# Patient Record
Sex: Male | Born: 1940 | Race: White | Hispanic: No | Marital: Married | Smoking: Former smoker
Health system: Southern US, Community
[De-identification: ages and names within clinical notes are randomized; demographics above are authoritative.]

## PROBLEM LIST (undated history)

## (undated) DIAGNOSIS — N189 Chronic kidney disease, unspecified: Secondary | ICD-10-CM

## (undated) DIAGNOSIS — I219 Acute myocardial infarction, unspecified: Secondary | ICD-10-CM

## (undated) DIAGNOSIS — G4733 Obstructive sleep apnea (adult) (pediatric): Secondary | ICD-10-CM

## (undated) DIAGNOSIS — E669 Obesity, unspecified: Secondary | ICD-10-CM

## (undated) DIAGNOSIS — I10 Essential (primary) hypertension: Secondary | ICD-10-CM

## (undated) DIAGNOSIS — E119 Type 2 diabetes mellitus without complications: Secondary | ICD-10-CM

## (undated) DIAGNOSIS — I2510 Atherosclerotic heart disease of native coronary artery without angina pectoris: Secondary | ICD-10-CM

## (undated) DIAGNOSIS — I739 Peripheral vascular disease, unspecified: Secondary | ICD-10-CM

## (undated) DIAGNOSIS — E785 Hyperlipidemia, unspecified: Secondary | ICD-10-CM

## (undated) HISTORY — DX: Essential (primary) hypertension: I10

## (undated) HISTORY — DX: Acute myocardial infarction, unspecified: I21.9

## (undated) HISTORY — PX: ANTERIOR FUSION LUMBAR SPINE: SUR629

## (undated) HISTORY — DX: Hyperlipidemia, unspecified: E78.5

## (undated) HISTORY — DX: Obstructive sleep apnea (adult) (pediatric): G47.33

## (undated) HISTORY — DX: Type 2 diabetes mellitus without complications: E11.9

## (undated) HISTORY — PX: LAMINECTOMY: SHX219

## (undated) HISTORY — DX: Peripheral vascular disease, unspecified: I73.9

## (undated) HISTORY — DX: Obesity, unspecified: E66.9

## (undated) HISTORY — DX: Chronic kidney disease, unspecified: N18.9

## (undated) HISTORY — DX: Atherosclerotic heart disease of native coronary artery without angina pectoris: I25.10

---

## 2002-06-07 ENCOUNTER — Inpatient Hospital Stay (HOSPITAL_COMMUNITY): Admission: EM | Admit: 2002-06-07 | Discharge: 2002-06-18 | Payer: Self-pay | Admitting: Emergency Medicine

## 2002-06-07 ENCOUNTER — Encounter: Payer: Self-pay | Admitting: Emergency Medicine

## 2002-06-08 ENCOUNTER — Encounter: Payer: Self-pay | Admitting: Cardiology

## 2002-06-08 ENCOUNTER — Encounter: Payer: Self-pay | Admitting: Cardiothoracic Surgery

## 2002-06-08 HISTORY — PX: CORONARY ARTERY BYPASS GRAFT: SHX141

## 2002-06-09 ENCOUNTER — Encounter: Payer: Self-pay | Admitting: Cardiothoracic Surgery

## 2002-06-10 ENCOUNTER — Encounter: Payer: Self-pay | Admitting: Cardiothoracic Surgery

## 2002-06-11 ENCOUNTER — Encounter: Payer: Self-pay | Admitting: Cardiothoracic Surgery

## 2002-06-12 ENCOUNTER — Encounter: Payer: Self-pay | Admitting: Cardiothoracic Surgery

## 2002-06-13 ENCOUNTER — Encounter: Payer: Self-pay | Admitting: Cardiothoracic Surgery

## 2002-06-14 ENCOUNTER — Encounter: Payer: Self-pay | Admitting: Cardiothoracic Surgery

## 2002-06-15 ENCOUNTER — Encounter: Payer: Self-pay | Admitting: Cardiothoracic Surgery

## 2002-06-16 ENCOUNTER — Encounter: Payer: Self-pay | Admitting: Cardiothoracic Surgery

## 2002-10-26 ENCOUNTER — Encounter: Payer: Self-pay | Admitting: Vascular Surgery

## 2002-10-27 ENCOUNTER — Ambulatory Visit (HOSPITAL_COMMUNITY): Admission: RE | Admit: 2002-10-27 | Discharge: 2002-10-27 | Payer: Self-pay | Admitting: Vascular Surgery

## 2003-02-22 ENCOUNTER — Ambulatory Visit: Admission: RE | Admit: 2003-02-22 | Discharge: 2003-02-22 | Payer: Self-pay | Admitting: Vascular Surgery

## 2003-03-17 ENCOUNTER — Ambulatory Visit (HOSPITAL_COMMUNITY): Admission: RE | Admit: 2003-03-17 | Discharge: 2003-03-18 | Payer: Self-pay | Admitting: Vascular Surgery

## 2004-06-11 ENCOUNTER — Ambulatory Visit: Payer: Self-pay | Admitting: Cardiology

## 2004-07-23 ENCOUNTER — Ambulatory Visit: Payer: Self-pay | Admitting: Cardiology

## 2004-07-27 ENCOUNTER — Ambulatory Visit: Payer: Self-pay | Admitting: Internal Medicine

## 2005-02-07 ENCOUNTER — Ambulatory Visit: Payer: Self-pay | Admitting: Cardiology

## 2005-02-13 ENCOUNTER — Ambulatory Visit: Payer: Self-pay | Admitting: Cardiology

## 2005-02-18 ENCOUNTER — Ambulatory Visit: Payer: Self-pay

## 2005-02-19 ENCOUNTER — Ambulatory Visit: Payer: Self-pay | Admitting: Internal Medicine

## 2005-02-20 ENCOUNTER — Ambulatory Visit: Payer: Self-pay | Admitting: Cardiology

## 2005-02-26 ENCOUNTER — Ambulatory Visit (HOSPITAL_BASED_OUTPATIENT_CLINIC_OR_DEPARTMENT_OTHER): Admission: RE | Admit: 2005-02-26 | Discharge: 2005-02-26 | Payer: Self-pay | Admitting: Family Medicine

## 2005-03-06 ENCOUNTER — Ambulatory Visit: Payer: Self-pay | Admitting: Internal Medicine

## 2005-03-06 ENCOUNTER — Encounter (INDEPENDENT_AMBULATORY_CARE_PROVIDER_SITE_OTHER): Payer: Self-pay | Admitting: Specialist

## 2005-03-16 ENCOUNTER — Ambulatory Visit: Payer: Self-pay | Admitting: Pulmonary Disease

## 2005-05-28 ENCOUNTER — Ambulatory Visit: Payer: Self-pay | Admitting: Cardiology

## 2006-01-08 ENCOUNTER — Ambulatory Visit: Payer: Self-pay | Admitting: Cardiology

## 2006-09-18 ENCOUNTER — Ambulatory Visit: Payer: Self-pay | Admitting: Cardiology

## 2006-09-18 LAB — CONVERTED CEMR LAB
ALT: 30 units/L (ref 0–40)
AST: 32 units/L (ref 0–37)
Albumin: 3.8 g/dL (ref 3.5–5.2)
Alkaline Phosphatase: 109 units/L (ref 39–117)
BUN: 18 mg/dL (ref 6–23)
Bilirubin, Direct: 0.2 mg/dL (ref 0.0–0.3)
CO2: 30 meq/L (ref 19–32)
Calcium: 8.9 mg/dL (ref 8.4–10.5)
Chloride: 106 meq/L (ref 96–112)
Cholesterol: 128 mg/dL (ref 0–200)
Creatinine, Ser: 1.1 mg/dL (ref 0.4–1.5)
GFR calc Af Amer: 86 mL/min
GFR calc non Af Amer: 71 mL/min
Glucose, Bld: 119 mg/dL — ABNORMAL HIGH (ref 70–99)
HDL: 38.6 mg/dL — ABNORMAL LOW (ref >39.0)
LDL Cholesterol: 68 mg/dL (ref 0–99)
Potassium: 4.4 meq/L (ref 3.5–5.1)
Sodium: 141 meq/L (ref 135–145)
Total Bilirubin: 1.4 mg/dL — ABNORMAL HIGH (ref 0.3–1.2)
Total CHOL/HDL Ratio: 3.3
Total Protein: 6.5 g/dL (ref 6.0–8.3)
Triglycerides: 106 mg/dL (ref 0–149)
VLDL: 21 mg/dL (ref 0–40)

## 2006-10-03 ENCOUNTER — Ambulatory Visit: Payer: Self-pay

## 2006-10-28 ENCOUNTER — Ambulatory Visit: Payer: Self-pay | Admitting: Vascular Surgery

## 2007-06-29 ENCOUNTER — Ambulatory Visit: Payer: Self-pay | Admitting: Cardiology

## 2007-06-29 LAB — CONVERTED CEMR LAB
ALT: 29 units/L (ref 0–53)
AST: 31 units/L (ref 0–37)
Albumin: 3.8 g/dL (ref 3.5–5.2)
Alkaline Phosphatase: 102 units/L (ref 39–117)
BUN: 19 mg/dL (ref 6–23)
Bilirubin, Direct: 0.2 mg/dL (ref 0.0–0.3)
CO2: 31 meq/L (ref 19–32)
Calcium: 9.4 mg/dL (ref 8.4–10.5)
Chloride: 106 meq/L (ref 96–112)
Cholesterol: 130 mg/dL (ref 0–200)
Creatinine, Ser: 1.2 mg/dL (ref 0.4–1.5)
GFR calc Af Amer: 77 mL/min
GFR calc non Af Amer: 64 mL/min
Glucose, Bld: 128 mg/dL — ABNORMAL HIGH (ref 70–99)
HDL: 33.3 mg/dL — ABNORMAL LOW (ref >39.0)
LDL Cholesterol: 67 mg/dL (ref 0–99)
Potassium: 4.3 meq/L (ref 3.5–5.1)
Sodium: 142 meq/L (ref 135–145)
Total Bilirubin: 0.8 mg/dL (ref 0.3–1.2)
Total CHOL/HDL Ratio: 3.9
Total Protein: 7 g/dL (ref 6.0–8.3)
Triglycerides: 150 mg/dL — ABNORMAL HIGH (ref 0–149)
VLDL: 30 mg/dL (ref 0–40)

## 2007-10-19 ENCOUNTER — Ambulatory Visit: Payer: Self-pay | Admitting: Cardiology

## 2007-10-19 LAB — CONVERTED CEMR LAB
ALT: 25 units/L (ref 0–53)
AST: 28 units/L (ref 0–37)
Albumin: 3.2 g/dL — ABNORMAL LOW (ref 3.5–5.2)
Alkaline Phosphatase: 87 units/L (ref 39–117)
Bilirubin, Direct: 0.2 mg/dL (ref 0.0–0.3)
Cholesterol: 137 mg/dL (ref 0–200)
HDL: 27.9 mg/dL — ABNORMAL LOW (ref >39.0)
LDL Cholesterol: 92 mg/dL (ref 0–99)
Total Bilirubin: 1.1 mg/dL (ref 0.3–1.2)
Total CHOL/HDL Ratio: 4.9
Total Protein: 6.3 g/dL (ref 6.0–8.3)
Triglycerides: 86 mg/dL (ref 0–149)
VLDL: 17 mg/dL (ref 0–40)

## 2007-11-10 ENCOUNTER — Ambulatory Visit: Payer: Self-pay | Admitting: Vascular Surgery

## 2008-06-22 ENCOUNTER — Ambulatory Visit: Payer: Self-pay | Admitting: Cardiology

## 2008-06-22 DIAGNOSIS — I739 Peripheral vascular disease, unspecified: Secondary | ICD-10-CM | POA: Insufficient documentation

## 2008-06-22 DIAGNOSIS — I25810 Atherosclerosis of coronary artery bypass graft(s) without angina pectoris: Secondary | ICD-10-CM

## 2008-06-22 DIAGNOSIS — E7800 Pure hypercholesterolemia, unspecified: Secondary | ICD-10-CM

## 2008-06-22 DIAGNOSIS — I701 Atherosclerosis of renal artery: Secondary | ICD-10-CM

## 2008-06-22 DIAGNOSIS — I6529 Occlusion and stenosis of unspecified carotid artery: Secondary | ICD-10-CM

## 2008-06-22 DIAGNOSIS — I10 Essential (primary) hypertension: Secondary | ICD-10-CM

## 2008-07-21 ENCOUNTER — Ambulatory Visit: Payer: Self-pay | Admitting: Cardiology

## 2008-07-21 ENCOUNTER — Ambulatory Visit: Payer: Self-pay

## 2008-07-21 LAB — CONVERTED CEMR LAB
ALT: 23 units/L (ref 0–53)
AST: 32 units/L (ref 0–37)
Albumin: 3.5 g/dL (ref 3.5–5.2)
Alkaline Phosphatase: 87 units/L (ref 39–117)
BUN: 16 mg/dL (ref 6–23)
Bilirubin, Direct: 0.3 mg/dL (ref 0.0–0.3)
CO2: 30 meq/L (ref 19–32)
Calcium: 8.7 mg/dL (ref 8.4–10.5)
Chloride: 105 meq/L (ref 96–112)
Cholesterol: 156 mg/dL (ref 0–200)
Creatinine, Ser: 0.9 mg/dL (ref 0.4–1.5)
GFR calc Af Amer: 108 mL/min
GFR calc non Af Amer: 89 mL/min
Glucose, Bld: 215 mg/dL — ABNORMAL HIGH (ref 70–99)
HDL: 35.1 mg/dL — ABNORMAL LOW (ref >39.0)
LDL Cholesterol: 103 mg/dL — ABNORMAL HIGH (ref 0–99)
Potassium: 5.1 meq/L (ref 3.5–5.1)
Sodium: 138 meq/L (ref 135–145)
Total Bilirubin: 1.1 mg/dL (ref 0.3–1.2)
Total CHOL/HDL Ratio: 4.4
Total Protein: 6.1 g/dL (ref 6.0–8.3)
Triglycerides: 91 mg/dL (ref 0–149)
VLDL: 18 mg/dL (ref 0–40)

## 2008-09-26 ENCOUNTER — Telehealth: Payer: Self-pay | Admitting: Cardiology

## 2008-10-25 ENCOUNTER — Encounter: Payer: Self-pay | Admitting: Cardiology

## 2008-11-10 ENCOUNTER — Ambulatory Visit: Payer: Self-pay | Admitting: Cardiology

## 2008-11-22 ENCOUNTER — Ambulatory Visit: Payer: Self-pay | Admitting: Vascular Surgery

## 2009-04-06 ENCOUNTER — Encounter (INDEPENDENT_AMBULATORY_CARE_PROVIDER_SITE_OTHER): Payer: Self-pay | Admitting: *Deleted

## 2009-07-24 ENCOUNTER — Telehealth: Payer: Self-pay | Admitting: Cardiology

## 2009-07-25 ENCOUNTER — Encounter: Payer: Self-pay | Admitting: Cardiology

## 2009-07-26 ENCOUNTER — Ambulatory Visit: Payer: Self-pay

## 2009-07-26 ENCOUNTER — Encounter: Payer: Self-pay | Admitting: Cardiology

## 2009-12-12 ENCOUNTER — Ambulatory Visit: Payer: Self-pay | Admitting: Vascular Surgery

## 2010-02-20 ENCOUNTER — Ambulatory Visit: Payer: Self-pay | Admitting: Cardiology

## 2010-02-21 ENCOUNTER — Telehealth: Payer: Self-pay | Admitting: Cardiology

## 2010-02-27 ENCOUNTER — Ambulatory Visit: Payer: Self-pay | Admitting: Cardiology

## 2010-02-27 DIAGNOSIS — E785 Hyperlipidemia, unspecified: Secondary | ICD-10-CM

## 2010-02-28 LAB — CONVERTED CEMR LAB
ALT: 29 units/L (ref 0–53)
AST: 20 units/L (ref 0–37)
Albumin: 3.6 g/dL (ref 3.5–5.2)
Alkaline Phosphatase: 96 units/L (ref 39–117)
BUN: 21 mg/dL (ref 6–23)
Bilirubin, Direct: 0.2 mg/dL (ref 0.0–0.3)
CO2: 29 meq/L (ref 19–32)
Calcium: 8.3 mg/dL — ABNORMAL LOW (ref 8.4–10.5)
Chloride: 102 meq/L (ref 96–112)
Cholesterol: 155 mg/dL (ref 0–200)
Creatinine, Ser: 1.1 mg/dL (ref 0.4–1.5)
GFR calc non Af Amer: 73.88 mL/min (ref >60)
Glucose, Bld: 146 mg/dL — ABNORMAL HIGH (ref 70–99)
HDL: 31.9 mg/dL — ABNORMAL LOW (ref >39.00)
LDL Cholesterol: 95 mg/dL (ref 0–99)
Potassium: 4.4 meq/L (ref 3.5–5.1)
Sodium: 138 meq/L (ref 135–145)
Total Bilirubin: 0.7 mg/dL (ref 0.3–1.2)
Total CHOL/HDL Ratio: 5
Total Protein: 6.3 g/dL (ref 6.0–8.3)
Triglycerides: 142 mg/dL (ref 0.0–149.0)
VLDL: 28.4 mg/dL (ref 0.0–40.0)

## 2010-03-06 ENCOUNTER — Telehealth (INDEPENDENT_AMBULATORY_CARE_PROVIDER_SITE_OTHER): Payer: Self-pay | Admitting: *Deleted

## 2010-03-07 ENCOUNTER — Ambulatory Visit: Payer: Self-pay

## 2010-03-07 ENCOUNTER — Encounter (HOSPITAL_COMMUNITY)
Admission: RE | Admit: 2010-03-07 | Discharge: 2010-05-19 | Payer: Self-pay | Source: Home / Self Care | Attending: Cardiology | Admitting: Cardiology

## 2010-03-07 ENCOUNTER — Encounter: Payer: Self-pay | Admitting: Cardiology

## 2010-03-07 ENCOUNTER — Ambulatory Visit: Payer: Self-pay | Admitting: Cardiology

## 2010-06-19 NOTE — Progress Notes (Signed)
Summary: meds  Phone Note Refill Request Message from:  Patient on February 21, 2010 12:38 PM  Refills Requested: Medication #1:  LIPITOR 80 MG TABS Take one tablet by mouth daily.  Medication #2:  LOPRESSOR 50 MG TABS 1 by mouth TWICE DAILY  Medication #3:  METFORMIN HCL 1000 MG TABS 1 tab by mouth two times a day please call in to [facility] on [address] in [place]. Pt state Lipitor mg was to be changed please check on this too.    Method Requested: Fax to [facility] Initial call taken by: Lorraine Lax,  February 21, 2010 12:38 PM  Follow-up for Phone Call        refilled meds called pt back to verify what med was increased.Marland KitchenMarland KitchenLisnopril was increased not lipitor.Marland Kitchen...will give pt refills...cannot refill metformin pt needs to call primary MD Follow-up by: Burnett Kanaris,  February 21, 2010 3:17 PM    Prescriptions: LIPITOR 80 MG TABS (ATORVASTATIN CALCIUM) Take one tablet by mouth daily.  #90 x 3   Entered by:   Burnett Kanaris   Authorized by:   Colin Mulders, MD, [facility]   Signed by:   Burnett Kanaris on 02/21/2010   Method used:   Electronically to        [facility] (retail)       [address]       [place], [place]  [address]       Ph: [phone]       Fax: [phone]   RxID:   [id number] LISINOPRIL 40 MG TABS (LISINOPRIL) 1 once daily  #90 x 3   Entered by:   Burnett Kanaris   Authorized by:   Colin Mulders, MD, [facility]   Signed by:   Burnett Kanaris on 02/21/2010   Method used:   Electronically to        [facility] (retail)       [address]       [place], [place]  [address]       Ph: [phone]       Fax: [phone]   RxID:   [id number] LOPRESSOR 50 MG TABS (METOPROLOL TARTRATE) 1 by mouth TWICE DAILY  #180 x 3   Entered by:   Burnett Kanaris   Authorized by:   Colin Mulders, MD, [facility]   Signed by:   Burnett Kanaris on  02/21/2010   Method used:   Electronically to        [facility] (retail)       [address]       [place], [place]  [address]       Ph: [phone]       Fax: [phone]   RxID:   [id number]

## 2010-06-19 NOTE — Miscellaneous (Signed)
Summary: Orders Update  Clinical Lists Changes  Orders: Added new Test order of Carotid Duplex (Carotid Duplex) - Signed 

## 2010-06-19 NOTE — Progress Notes (Signed)
Summary: Nuclear pre procedure  Phone Note Outgoing Call Call back at [facility] Phone [phone]   Call placed by: Valetta Fuller, [place],  March 06, 2010 4:44 PM Call placed to: Patient Summary of Call: Left message with information on Myoview Information Sheet (see scanned document for details).      Nuclear Med Background Indications for Stress Test: Evaluation for Ischemia, Graft Patency   History: CABG, Echo, Myocardial Infarction, Myocardial Perfusion Study  History Comments: '08 MPS:no ischemia  Symptoms: DOE    Nuclear Pre-Procedure Cardiac Risk Factors: Carotid Disease, History of Smoking, Hypertension, Lipids, NIDDM, Obesity, PVD Height (in): 67

## 2010-06-19 NOTE — Progress Notes (Signed)
Summary: test results from [facility]  Phone Note Call from Patient Call back at Home Phone [phone]   Caller: Patient Reason for Call: Talk to Nurse Details for Reason: Per pt calling. did [place] [facility] ever seen the test results heart stress , gallbladder. b/c bc ask pt the last time he was here in the office.  Initial call taken by: Neil Crouch,  July 24, 2009 10:13 AM  Follow-up for Phone Call        spoke with pt, requested his records from [facility] from may 2010. Fredia Beets, RN  July 24, 2009 11:07 AM

## 2010-06-19 NOTE — Assessment & Plan Note (Signed)
Summary: F1Y   CC:  yearly check up.  History of Present Illness: [name] is a very pleasant gentleman who has a history of coronary artery disease status post bypass and graft in 2004.  Note a distal aortogram at the time of his cardiac catheterization revealed an 80% left renal artery stenosis and an 80% focal left iliac stenosis. He's had prior percutaneous intervention by vascular surgery and they follow this. His most recent Myoview was performed in May 2008.  At that time, there was minimal thinning of the distal anterior wall, which did not appear to be significant.  It was felt to most likely be normal.  His ejection fraction was 67%. Last carotid Dopplers performed in March 2011 showed 0-39% bilateral stenosis and followup was recommended in 1 year. I last saw him in June of 2010. Since then, the patient has dyspnea with more extreme activities but not with routine activities. It is relieved with rest. It is not associated with chest pain. There is no orthopnea, PND or pedal edema. There is no syncope or palpitations. There is no exertional chest pain.   Current Medications (verified): 1)  Lisinopril 20 Mg Tabs (Lisinopril) .Marland Kitchen.. 1 By Mouth Daily 2)  Lopressor 50 Mg Tabs (Metoprolol Tartrate) .Marland Kitchen.. 1 By Mouth Twice Daily 3)  Pravastatin Sodium 80 Mg Tabs (Pravastatin Sodium) .... One By Mouth At Bedtime 4)  Aspirin 81 Mg Tbec (Aspirin) .... Take One Tablet By Mouth Daily 5)  Metformin Hcl 1000 Mg Tabs (Metformin Hcl) .Marland Kitchen.. 1 Tab By Mouth Two Times A Day 6)  Lipitor 80 Mg Tabs (Atorvastatin Calcium) .... Take One Tablet By Mouth Daily. 7)  Vitamin D .... 1 Tab By Mouth Once Daily  Allergies: 1)  Codeine Phosphate (Codeine Phosphate) 2)  Codeine Phosphate (Codeine Phosphate)  Past History:  Past Medical History: Reviewed history from 06/22/2008 and no changes required. CAD Cerebrovascular Disease Hyperlipidemia Hypertension PVD H/O RAS  Past Surgical History: Reviewed history  from 06/22/2008 and no changes required. Emergency coronary artery bypass grafting x4 (left IMA to LAD,  saphenous vein graft to diagonal, saphenous vein graft to ramus intermedius,  saphenous vein graft to obtuse marginal). 06-08-02  Primary percutaneous transluminal angioplasty (PTA) and stenting of left     lower (dominant) renal artery using a 6 x 12 Genesis on Aviator system at     10 atmospheres for 30 seconds.  Primary percutaneous transluminal angioplasty (PTA) and stenting of left     upper renal artery (smaller) using 5 x 12 Genesis on Aviator system at 9     atmospheres for 30 seconds.03/17/03 PTA and stenting of severe left common iliac stenosis using an 18 mm x 8     mm Genesis stent on Opta balloon ([id number]) at 9 atmospheres for 45     seconds with second angioplasty using a 9 mm x 2 cm Powerflex catheter at     10 atmospheres for 30 seconds.10/27/02 Appendectomy Tonsillectomy H/O Back sugery Previous multiple bone fractures  Social History: Reviewed history from 06/22/2008 and no changes required. Tobacco Use - Former.  Alcohol Use - yes (rare)  Review of Systems       no fevers or chills, productive cough, hemoptysis, dysphasia, odynophagia, melena, hematochezia, dysuria, hematuria, rash, seizure activity, orthopnea, PND, pedal edema, claudication. Remaining systems are negative.   Vital Signs:  Patient profile:   70 year old male Height:      67 inches Weight:      274 pounds  BMI:     43.07 Pulse rate:   66 / minute Resp:     14 per minute BP sitting:   159 / 92  (left arm)  Vitals Entered By: Burnett Kanaris (February 20, 2010 8:53 AM)  Physical Exam  General:  Well-developed well-nourished in no acute distress.  Skin is warm and dry.  HEENT is normal.  Neck is supple. No thyromegaly.  Chest is clear to auscultation with normal expansion.  Cardiovascular exam is regular rate and rhythm.  Abdominal exam nontender or distended. No masses  palpated. Extremities show no edema. neuro grossly intact    EKG  Procedure date:  02/20/2010  Findings:      Sinus rhythm at a rate of 66. Axis normal. No ischemic changes.  Impression & Recommendations:  Problem # 1:  HYPERCHOLESTEROLEMIA, PURE (ICD-272.0) Continue Lipitor. Check lipids and liver. The following medications were removed from the medication list:    Pravastatin Sodium 80 Mg Tabs (Pravastatin sodium) ..... One by mouth at bedtime His updated medication list for this problem includes:    Lipitor 80 Mg Tabs (Atorvastatin calcium) .Marland Kitchen... Take one tablet by mouth daily.  His updated medication list for this problem includes:    Pravastatin Sodium 80 Mg Tabs (Pravastatin sodium) ..... One by mouth at bedtime    Lipitor 80 Mg Tabs (Atorvastatin calcium) .Marland Kitchen... Take one tablet by mouth daily.  Problem # 2:  HYPERTENSION, BENIGN (ICD-401.1)  Blood pressure elevated. Increase lisinopril to 40 mg p.o. daily. Check potassium and renal function in one week. His updated medication list for this problem includes:    Lisinopril 40 Mg Tabs (Lisinopril) .Marland Kitchen... 1 once daily    Lopressor 50 Mg Tabs (Metoprolol tartrate) .Marland Kitchen... 1 by mouth twice daily    Aspirin 81 Mg Tbec (Aspirin) .Marland Kitchen... Take one tablet by mouth daily  Orders: Renal Artery Duplex (Renal Artery Duplex)  Problem # 3:  RENAL ATHEROSCLEROSIS (ICD-440.1) Repeat renal Dopplers.  Problem # 4:  PVD (ICD-443.9) Continue aspirin and statin.  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 His updated medication list for this problem includes:    Aspirin 81 Mg Tbec (Aspirin) .Marland Kitchen... Take one tablet by mouth daily  His updated medication list for this problem includes:    Aspirin 81 Mg Tbec (Aspirin) .Marland Kitchen... Take one tablet by mouth daily  Problem # 6:  CAD, ARTERY BYPASS GRAFT (ICD-414.04)  Continued aspirin, beta blocker, statin and ACE inhibitor. Schedule  myoview. His updated medication list for this problem includes:    Lisinopril 40 Mg Tabs (Lisinopril) .Marland Kitchen... 1 once daily    Lopressor 50 Mg Tabs (Metoprolol tartrate) .Marland Kitchen... 1 by mouth twice daily    Aspirin 81 Mg Tbec (Aspirin) .Marland Kitchen... Take one tablet by mouth daily  Orders: EKG w/ Interpretation (93000) Nuclear Stress Test (Nuc Stress Test)  His updated medication list for this problem includes:    Lisinopril 20 Mg Tabs (Lisinopril) .Marland Kitchen... 1 by mouth daily    Lopressor 50 Mg Tabs (Metoprolol tartrate) .Marland Kitchen... 1 by mouth twice daily    Aspirin 81 Mg Tbec (Aspirin) .Marland Kitchen... Take one tablet by mouth daily  Patient Instructions: 1)  Your physician recommends that you schedule a follow-up appointment in: [place] 2)  Your physician recommends that you return for lab work in:1 WEEK BMET LIPID LIVER 272.4 V58.69 401.1 3)  Your physician has recommended you make the following change in your medication:  INCREASE LISINOPRIL TO 40 MG once daily 4)  Your physician has requested that you have a renal artery duplex. During this test, an ultrasound is used to evaluate blood flow to the kidneys. Allow one hour for this exam. Do not eat after midnight the day before and avoid carbonated beverages. Take your medications as you usually do. 5)  Your physician has requested that you have an exercise stress myoview.  For further information please visit [email or web address].  Please follow instruction sheet, as given. Prescriptions: LISINOPRIL 40 MG TABS (LISINOPRIL) 1 once daily  #30 x 11   Entered by:   Devra Dopp, LPN   Authorized by:   Colin Mulders, MD, [facility]   Signed by:   Devra Dopp, LPN on 624THL   Method used:   Electronically to        [facility] (retail)       [address]        [place], [place]  [address]       Ph: [phone]       Fax: [phone]   RxID:   [id number]

## 2010-06-19 NOTE — Assessment & Plan Note (Signed)
Summary: Cardiology Nuclear Testing  Nuclear Med Background Indications for Stress Test: Evaluation for Ischemia, Graft Patency   History: CABG, Echo, Myocardial Infarction, Myocardial Perfusion Study  History Comments: '08 MPS:no ischemia  Symptoms: DOE, Fatigue    Nuclear Pre-Procedure Cardiac Risk Factors: Carotid Disease, History of Smoking, Hypertension, Lipids, NIDDM, Obesity, PVD Caffeine/Decaff Intake: None NPO After: 9:00 PM Lungs: Clear.  O2 Sat 98% on RA. IV 0.9% NS with Angio Cath: 24g     IV Site: R Hand IV Started by: Irven Baltimore, RN Chest Size (in) 54     Height (in): 67 Weight (lb): 270 BMI: 42.44 Tech Comments: The patient took metformin and lopressor this a.m.  Nuclear Med Study 1 or 2 day study:  1 day     Stress Test Type:  Carlton Adam Reading MD:  Dola Argyle, MD     Referring MD:  Kirk Ruths, MD Resting Radionuclide:  Technetium 40m Tetrofosmin     Resting Radionuclide Dose:  11 mCi  Stress Radionuclide:  Technetium 36m Tetrofosmin     Stress Radionuclide Dose:  33 mCi   Stress Protocol   Lexiscan: 0.4 mg   Stress Test Technologist:  Valetta Fuller, CMA-N     Nuclear Technologist:  Annye Rusk, CNMT  Rest Procedure  Myocardial perfusion imaging was performed at rest 45 minutes following the intravenous administration of Technetium 58m Tetrofosmin.  Stress Procedure  The patient received IV Lexiscan 0.4 mg over 15-seconds.  Technetium 22m Tetrofosmin injected at 30-seconds.  There were no significant changes with infusion.  Quantitative spect images were obtained after a 45 minute delay.  QPS Raw Data Images:  Patient motion noted; appropriate software correction applied. Stress Images:  Normal homogeneous uptake in all areas of the myocardium. Rest Images:  Normal homogeneous uptake in all areas of the myocardium. Subtraction (SDS):  No evidence of ischemia. Transient Ischemic Dilatation:  0.98  (Normal <1.22)  Lung/Heart Ratio:  0.43   (Normal <0.45)  Quantitative Gated Spect Images QGS EDV:  83 ml QGS ESV:  30 ml QGS EF:  64 % QGS cine images:  Normal motion  Findings Normal nuclear study      Overall Impression  Exercise Capacity: Lexiscan with no exercise. BP Response: Normal blood pressure response. Clinical Symptoms: nausea ECG Impression: No significant ST segment change suggestive of ischemia. Overall Impression: Normal stress nuclear study.  Appended Document: Cardiology Nuclear Testing ok  Appended Document: Cardiology Nuclear Testing pt aware of results

## 2010-10-02 NOTE — Assessment & Plan Note (Signed)
[name] OFFICE NOTE   [name], [name]                          MRN:          [id number]  DATE:06/22/2008                            DOB:          07/14/1940    [name] is a very pleasant gentleman who has a history of coronary  artery disease status post bypass and graft in 2004.  His most recent  Myoview was performed in May 2008.  At that time, there was minimal  thinning of the distal anterior wall, which did not appear to be  significant.  It was felt to most likely be normal.  His ejection  fraction was 67%.  Since I last saw him, he is doing well from a  symptomatic standpoint.  There is no dyspnea, chest pain, palpitations,  or syncope.   CURRENT MEDICATIONS:  1. Lipitor 80 mg p.o. daily.  2. Lopressor 50 mg p.o. b.i.d.  3. Aspirin 81 mg p.o. daily.  4. Lisinopril 20 mg p.o. daily.  5. Niaspan 1 g p.o. daily.   PHYSICAL EXAMINATION:  VITAL SIGNS:  Blood pressure of 143/89 and his  pulse is 63.  He weighs 271 pounds.  HEENT:  Normal.  NECK:  Supple.  No bruits.  CHEST:  Clear.  CARDIOVASCULAR:  Regular rate.  ABDOMEN:  No tenderness.  EXTREMITIES:  No edema.   His electrocardiogram shows a sinus rhythm at a rate of 62.  The axis is  normal.  There are minor nonspecific ST changes.   DIAGNOSES:  1. Coronary artery disease status post coronary bypass and graft - Mr.      [name] is doing well from symptomatic standpoint.  We will continue      with medical therapy including his aspirin, statin, angiotensin-      converting enzyme inhibitor, and beta-blocker.  He does not smoke.  2. History of mild cerebrovascular disease - he is due for followup      carotid Dopplers and we will arrange those.  3. Hyperlipidemia - per his insurance he would like to change to a      generic statin.  We will discontinue his Lipitor and begin      Pravachol 80 mg p.o. daily.  He will continue on his Niaspan.   We      will check lipids and liver in 4 weeks and adjust as indicated.  4. Hypertension - his blood pressure is mildly elevated today.      However, he states it runs in the 110-115 range over 70 range at      home.  We will continue with his lisinopril.  When he returns for      his lipids and liver, I will check a BMET.  5. Peripheral vascular disease - he is following with Dr. Kellie Simmering      concerning this issue.   He will be seen back in 1 year.     Denice Bors Stanford Breed, MD, [facility]  Electronically Signed    BSC/MedQ  DD: 06/22/2008  DT: 06/22/2008  Job #: [id number]

## 2010-10-02 NOTE — Procedures (Signed)
LOWER EXTREMITY ARTERIAL EVALUATION-SINGLE LEVEL   INDICATION:  Follow-up evaluation, status post left CIA and stent.  Patient states that he has transient toe numbness at rest, which is  relieved by dependency.  Patient denies claudication bilaterally.   HISTORY:  Diabetes:  No.  Cardiac:  CABG x4 in January, 2004.  Hypertension:  Yes.  Smoking:  No.  Previous Surgery:  Left CIA and stent on 10/27/02 by Dr. Kellie Simmering.   RESTING SYSTOLIC PRESSURES: (ABI)                          RIGHT                LEFT  Brachial:               150                  140  Anterior tibial:        148                  158  Posterior tibial:       160 (1.06)           158 (1.03)  Peroneal:  DOPPLER WAVEFORM ANALYSIS:  Anterior tibial:        Triphasic            Triphasic  Posterior tibial:       Triphasic            Triphasic  Peroneal:   PREVIOUS ABI'S:  Date: 10/28/06  RIGHT:  1.05  LEFT:  1.03   IMPRESSION:  Bilateral ankle brachial indices unchanged from previous  examination.   ___________________________________________  Nelda Severe Kellie Simmering, M.D.   PB/MEDQ  D:  11/10/2007  T:  11/10/2007  Job:  [id number]

## 2010-10-02 NOTE — Assessment & Plan Note (Signed)
[facility] OFFICE NOTE   [name], [name]                          MRN:          [id number]  DATE:06/29/2007                            DOB:          March 20, 1941    [name] is a very pleasant gentleman with a history of coronary  disease, status post coronary artery bypass graft in 2004.  His most  recent Myoview was performed on Oct 03, 2006.  At that time, he was  found to have minimal thinning in the distal anterior wall, but there  was no ischemia.  The ejection fraction was 63%.  His last carotid  Dopplers were in May of 2008 and there was 0%-39% internal carotid  artery stenosis bilaterally.  Followup was recommended in 2 years.   Since I last saw him, he denies any chest pain.  He does have dyspnea on  exertion which has been a chronic issue.  There is no orthopnea, PND or  pedal edema.  There is no syncope.   MEDICATIONS INCLUDE:  1. Zetia 10 mg p.o. daily.  2. Lipitor 80 mg p.o. daily.  3. Lopressor 50 mg p.o. b.i.d.  4. Altace 5 mg p.o. b.i.d.  5. Aspirin 81 mg daily.  6. Allopurinol.  7. Avodart.   PHYSICAL EXAM:  Today shows a blood pressure 141/80 and his pulse is 68.  He weighs 269 pounds.  HEENT:  Normal.  NECK:  Supple with no bruits.  CHEST:  Clear.  CARDIOVASCULAR EXAM:  With regular rate and rhythm.  ABDOMINAL EXAM:  Shows no tenderness.  EXTREMITIES:  Show trace edema bilaterally.   His electrocardiogram shows a sinus rhythm at a rate of 71.  There were  no significant ST changes noted.   DIAGNOSES:  1. Coronary artery disease status post coronary artery bypass graft.      The patient's most recent Myoview showed no ischemia.  We will      continue medical therapy including his aspirin, statin, ACE      inhibitor, and beta blocker.  He will continue with risk factor      modification including diet and exercise.  He does not smoke.  2. History of mild cerebrovascular disease.   He will need follow up      carotid Dopplers in May 2010.  3. Hyperlipidemia.  We will check lipids and liver enzymes and adjust      his regimen as indicated.  4. Hypertension.  His blood pressure is borderline today.  However, he      states that it runs in the 130/80 range at home.  We will continue      his present medications, and I will check a BMET given his ACE      inhibitor use.  We can increase medications in the future as      indicated.  5. Peripheral vascular disease.  He is to followup with Dr. Kellie [name]      concerning this.   He will see me back in 1 year.     Xavier Bors Crenshaw,  MD, [facility]  Electronically Signed    BSC/MedQ  DD: 06/29/2007  DT: 06/30/2007  Job #: [id number]   cc:   [name]

## 2010-10-05 NOTE — Assessment & Plan Note (Signed)
[facility]                            CARDIOLOGY OFFICE NOTE   [name]                          MRN:          [id number]  DATE:09/18/2006                            DOB:          27-Sep-1940    [name] is a very pleasant gentleman who has a history of coronary  artery disease status post coronary bypassing graft in 2004.  He also  has peripheral vascular disease, hypertension and hyperlipidemia.  Since  I last saw him, he does have some dyspnea on exertion, which has been a  chronic issue.  He occasionally feels a burning sensation in his chest  that is unrelated to exertion.  It is not related to food.  It lasts for  several minutes and resolves spontaneously.  He has not had any  palpitations or syncope.   MEDICATIONS:  1. Zetia 10 mg p.o. daily.  2. Lipitor 80 mg p.o. daily.  3. Metoprolol 50 mg p.o. b.i.d.  4. Altace 5 mg p.o. b.i.d.  5. Aspirin 81 mg p.o. daily.  6. Allopurinol 300 mg p.o. daily.  7. Indocin 25 mg p.r.n.  8. Avodart.   PHYSICAL EXAMINATION TODAY:  VITAL SIGNS:  Blood pressure of 131/85,  pulse 56.  He weighs 272 pounds.  NECK:  Supple.  CHEST:  Clear.  CARDIOVASCULAR:  Regular rate and rhythm.  EXTREMITIES:  No edema.   ELECTROCARDIOGRAM:  Sinus rhythm at a rate of 56.  The axis is normal.  There are nonspecific ST changes.   DIAGNOSES:  1. Coronary artery disease status post coronary bypassing graft.  The      patient is having vague chest pain that does not sound cardiac.      However, we will plan to risk stratify with a Myoview.  If it shows      normal perfusion, we will not proceed with further cardiac      evaluation.  He will continue on his aspirin, statin, ACE inhibitor      and beta blocker.  He will need to continue with risk factor      modification.  2. History of mild cerebrovascular disease at the time of coronary      artery bypassing graft.  We will plan to repeat his carotid  Dopplers.  3. Hyperlipidemia.  We will check lipids and liver today and adjust      his regimen as indicated.  4. Hypertension.  We will discontinue his Altace and begin lisinopril      20 mg p.o. daily for financial reasons.  We will also check a BMET      to follow his potassium and renal function.  5. Peripheral vascular disease.  The patient will follow up with Dr.      Kellie Simmering concerning this issue.   We will see him back in 9 months.     Xavier Bors Stanford Breed, MD, [facility]  Electronically Signed    BSC/MedQ  DD: 09/18/2006  DT: 09/18/2006  Job #: [id number]   cc:   [name]

## 2010-10-05 NOTE — Consult Note (Signed)
[name], [name]                             ACCOUNT NO.:  [id number]   MEDICAL RECORD NO.:  [id number]                   PATIENT TYPE:  INP   LOCATION:  2926                                 FACILITY:  [facility]   PHYSICIAN:  Len Childs, M.D.           DATE OF BIRTH:  05/16/1941   DATE OF CONSULTATION:  06/08/2002  DATE OF DISCHARGE:                                   CONSULTATION   PRIMARY CARE PHYSICIAN:  None.   REASON FOR CONSULTATION:  Severe two-vessel coronary artery disease with  acute evolving anterior myocardial infarction.   CHIEF COMPLAINT:  Chest pain.   HISTORY OF PRESENT ILLNESS:  I was asked to evaluate this 70 year old white  male for potential surgical coronary revascularization for recently  diagnosed severe coronary artery disease and an acute evolving MI.  The  patient has no prior history of cardiac disease, no history of MI, and no  history of cardiac murmur.  He developed early morning sudden epigastric  pain on June 07, 2002, with radiation to the shoulders and back.  He had  associated shortness of breath, nausea, and diaphoresis.  The patient states  with time the pain improved, but did not resolve.  He was brought to the  emergency room where he was found to have cardiac enzymes positive with a  CPK-MB of 38 ng/ml.  His EKG demonstrated nonspecific ST segment changes.  He was admitted to the hospital and placed on heparin and Integrilin and  underwent cardiac catheterization today by Minus Breeding, M.D.  This  demonstrated 99% stenosis of the LAD with TIMI-1 flow, 90% stenosis of the  diagonal, 95% stenosis of the ramus intermediate, and 90% stenosis of the  circumflex marginal.  The right coronary artery did not have any  hemodynamically significant lesions.  His overall ejection fraction was 50%.  A balloon pump was placed in the catheterization lab for severe chest pain  during the procedure.  An urgent cardiac surgical consultation was  requested.   PAST MEDICAL HISTORY:  1. Obesity.  2. Past history of smoking for 25 years.  Quit three months ago.  3. 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   ALLERGIES:  He states that he is allergic to CODEINE.   HOME MEDICATIONS:  Vioxx p.r.n. arthritis.   SOCIAL HISTORY:  The patient is a retired Nurse, learning disability.  He is now retired  due to his bad back.  He smoked for over 25 years, but quit three months  ago.  He does not use alcohol.   FAMILY HISTORY:  Positive for coronary artery disease.  Negative for  diabetes.   REVIEW OF SYSTEMS:  The patient has gained 20 pounds since he stopped  smoking.  He denies any recent fever or night sweats.  ENT review is  negative for change in vision or difficulty swallowing.  The pulmonary  review  is negative for productive cough, hemoptysis, recent pneumonia, or  bronchitis.  The cardiac review is positive for his unstable angina and his  acute MI.  The GI review is negative for abdominal pain, jaundice, and  hepatitis.  The musculoskeletal review is positive for lower back pain and  some shoulder arthritis.  The vascular review is negative for DVT, TIA, or  claudication.  The hematologic review is negative for bleeding disorder or  previous blood transfusion.  The endocrine review is negative for diabetes  or thyroid disease.  The neurologic review is negative for stroke or  seizure.  The patient has had significant change in his memory over the past  few months.  A head CT was performed on admission, which showed no  abnormality.  Also, carotid duplex studies were performed on admission,  which showed no hemodynamically significant stenosis of either carotid  artery.  The dermatologic review is negative.  He is right-hand dominant.   PHYSICAL EXAMINATION:  HEIGHT:  He is 5 feet 6 inches.  WEIGHT:  He weighs 250 pounds.  VITAL SIGNS:  The blood pressure is 140/70, heart rate 60 and regular, and  respirations  18.  GENERAL APPEARANCE:  That of a middle-aged, obese, white male in the CCU  accompanied by his family.  He is supported with a balloon pump and is on IV  heparin.  He is not complaining of chest pain at this time.  HEENT:  Normocephalic.  Full EOMs.  Dentition under adequate repair.  NECK:  Without JVD, mass, or carotid bruit.  LUNGS:  Distant breath sounds.  CHEST:  There is no thoracic deformity.  CARDIAC:  Regular rate and rhythm without murmur or gallop.  ABDOMEN:  Obese, nontender.  Without mass or organomegaly.  EXTREMITIES:  2+ pulses on the right pedal and no palpable pulse on the  left.  No evidence of venous stasis disease of either lower extremity.  RECTAL:  Deferred.  MUSCULOSKELETAL:  No clubbing, swollen, tender joints, or edema.  NEUROLOGIC:  Intact.  SKIN:  Without rash or lesion.  LYMPHATICS:  No palpable supraclavicular or axillary adenopathy.   LABORATORY DATA:  I reviewed the coronary arteriograms with Minus Breeding,  M.D., and agree with the interpretation of severe three-vessel disease with  acute MI and a stuttering LAD between 99% and 100% occlusion.   IMPRESSION:  I agree with recommendation for urgent surgical coronary  revascularization based on his bad coronary anatomy and his unstable  symptoms.  I discussed the procedure with the patient and his family,  including the indications, benefits, and alternatives to surgical therapy  for his coronary artery disease.  I reviewed with the patient major aspects  of the proposed operation, including the choice of conduits, the use of the  heart/lung bypass, general anesthesia, and the placement of the surgical  incisions.  He understands the associated risks of MI, CVA,  bleeding, blood transfusion requirement, infection, and death.  He agrees to  proceed with the operation as explained under what I feel is an informed  consent.  Thank you very much for this consultation.                                                Len Childs, M.D.    PV/MEDQ  D:  06/08/2002  T:  06/08/2002  Job:  [id number]   cc:   CVTS Office   Minus Breeding, M.D. [facility]  [address] [address]  Fax: 1
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
## 2010-10-05 NOTE — Cardiovascular Report (Signed)
NAMEBENTLEE, PACKETT                             ACCOUNT NO.:  [id number]   MEDICAL RECORD NO.:  [id number]                   PATIENT TYPE:  INP   LOCATION:  2926                                 FACILITY:  [facility]   PHYSICIAN:  Minus Breeding, M.D. [facility]            DATE OF BIRTH:  07/28/1940   DATE OF PROCEDURE:  06/08/2002  DATE OF DISCHARGE:                              CARDIAC CATHETERIZATION   DATE OF BIRTH:  10/30/1940   PRIMARY CARE PHYSICIAN:  None.   REASON FOR PRESENTATION:  Evaluate patient with non-Q-wave myocardial  infarction and unstable angina.   DESCRIPTION OF PROCEDURE:  Left heart catheterization was performed via the  right femoral artery. The artery was cannulated using the anterior wall  puncture. A #6 French arterial sheath was inserted via the modified  Seldinger technique. Preformed Judkins and a pigtail catheter were utilized.  Because of ongoing chest pain following the procedure a balloon pump was  inserted by Dr. Lyndel Safe without complications.   RESULTS:  HEMODYNAMICS:  LV 164/94, AO 164/22.   CORONARY ARTERIES:  The left main was normal.   The LAD had ostial and proximal 25% stenosis.  There was a long 30% stenosis  after the first septal perforator and second diagonal. There appeared to be  a ruptured plaque in this area.  There was a long mid 99% stenosis. The  distal vessel did not complete fill (TIMI-1 flow).  It appeared to be free  of high-grade disease.  There was a first diagonal (ramus intermediate).  This was moderate sized with proximal 99% stenosis. A second diagonal was  large and had ostial 30% stenosis.   The circumflex in the AV groove had a 95% stenosis before a large obtuse  marginal.   The right coronary artery is a dominant vessel.  There were diffuse luminal  irregularities.  There was mid 30% stenosis.  There were collaterals from  the RCA to the LAD.   LEFT VENTRICULOGRAM: The left ventriculogram was obtained in the RAO and  the  LAO projection. The EF appeared to be approximately 50% with mid anterior,  anteroapical and septal hypokinesis.   DISTAL AORTOGRAM:  A distal aortogram was obtained and demonstrated moderate  plaquing below the renal arteries and bilateral iliacs.  There was 80% left  renal artery stenosis.  There was an 80% focal left iliac stenosis.   CONCLUSIONS:  Severe two-vessel coronary artery disease.  Mild left  ventricular dysfunction.  Moderate to severe peripheral vascular disease.    PLAN:  We will try to manage the patient medically as the LAD is not ideal  for percutaneous revascularization.  We will place a balloon pump. He will  have a CVTS consult. If he continues to have chest discomfort and becomes  unstable, we will percutaneously attempt treatment of the LAD.  Minus Breeding, M.D. [facility]    JH/MEDQ  D:  06/08/2002  T:  06/08/2002  Job:  [id number]

## 2010-10-05 NOTE — Op Note (Signed)
NAME:  [name], [name]                           ACCOUNT NO.:  [id number]   MEDICAL RECORD NO.:  [id number]                   PATIENT TYPE:  OIB   LOCATION:  2887                                 FACILITY:  [facility]   PHYSICIAN:  Nelda Severe. Kellie Simmering, M.D.               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   PROCEDURE:  1. Abdominal aortogram via right common femoral approach.  2. Selective left upper renal angiogram.  3. Selective left lower renal angiogram.  4. Primary percutaneous transluminal angioplasty (PTA) and stenting of left     lower (dominant) renal artery using a 6 x 12 Genesis on Aviator system at     10 atmospheres for 30 seconds.  5. Primary percutaneous transluminal angioplasty (PTA) and stenting of left     upper renal artery (smaller) using 5 x 12 Genesis on Aviator system at 9     atmospheres for 30 seconds.   SURGEON:  Nelda Severe. Kellie Simmering, M.D.   FIRST ASSISTANT:  Dorothea Glassman, M.D.   ANESTHESIA:  1. Local Xylocaine and Versed 1 mg intravenously.  2. Heparin 5000 units contrast 185 cc.   COMPLICATIONS:  None.   DESCRIPTION OF PROCEDURE:  The patient was taken to [facility] Peripheral  Endovascular Lab and placed in the supine position at which time both groins  were prepped with Betadine scrub and solution and draped in routine sterile  manner.  After infiltration with 1% Xylocaine with epinephrine, the right  common femoral artery was entered percutaneously.  Guidewire was passed into  the suprarenal aorta and a 5 French sheath and dilator were passed over the  guidewire.  The dilator was removed and a standard pigtail catheter  positioned in the suprarenal aorta and a flush abdominal aortogram  performed  injecting 20 cc of contrast at 20 cc per second.  This revealed a single  right renal artery which was widely patent and 2 left renal arteries which  were similar sized, although the lower one was slightly larger.  The lower  renal artery had a 90% osteal stenosis which was quite short, and the upper  left renal artery had an approximately 80% osteal stenosis which was short.   The 5 French sheath was then removed and replaced with a JR-4, 6 [place],  guide catheter and using a 0.014 stabilizer wire the lower renal artery was  cannulated and a confirmatory angiogram performed through the sheath.  Five  thousand units of heparin had been administered intravenously. A 6 x 12  Genesis on Aviator system was utilized and the lower renal artery osteal  stenosis was primarily dilated  and stented at 10 atmospheres for 30 seconds  and a postangioplasty angiogram revealed a widely patent left renal artery  with no residual stenosis.  Following this an attempt was made to cannulate  the upper renal artery with the JR-4 guide catheter which was unsuccessful;  therefore, it was exchanged for an IMA guide catheter and the left upper  renal artery was cannulated, guidewire advanced an angiogram performed  through the catheter to confirm previous findings.   The left upper renal artery was then primarily dilated and stented using a 5  x 12 Genesis on Aviator system at 9 atmospheres for 30 seconds and a post  angioplasty angiogram revealed complete resolution of the stenosis at the  ostium.  The guide catheter was then removed and exchanged for a pigtail  catheter and a completion aortogram performed revealing 2 widely patent left  renal artery and a widely patent right renal artery.  There was a previous  left common iliac stent which was widely patent and there was a moderate  stenosis at the origin of the right common iliac artery. Having tolerated  the procedure well the sheath was removed  after the heparin was reversed.  Adequate hemostasis occurred.  No complications ensued.   FINDINGS:  1. 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  2. An 80% upper left renal artery osteal stenosis with widely patent right     renal artery.  3. Widely patent left common iliac artery with previous stent placement and     moderate stenosis of right common iliac artery.  4. Successful PTA and primary stenting of left lower renal artery with 6 x     12 Genesis on Aviator system.  5. Successful primary PTA and stenting of left upper renal artery with 5 x     12 Genesis on Aviator system.                                               Nelda Severe Kellie Simmering, M.D.    JDL/MEDQ  D:  03/17/2003  T:  03/17/2003  Job:  [id number]

## 2010-10-05 NOTE — Op Note (Signed)
[name], [name]                             ACCOUNT NO.:  [id number]   MEDICAL RECORD NO.:  [id number]                   PATIENT TYPE:  OIB   LOCATION:  2899                                 FACILITY:  [facility]   PHYSICIAN:  Nelda Severe. Kellie Simmering, M.D.               DATE OF BIRTH:  1940/07/27   DATE OF PROCEDURE:  10/27/2002  DATE OF DISCHARGE:                                 OPERATIVE REPORT   PREOPERATIVE DIAGNOSES:  1. 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  2. 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  3. Hypertension.   PROCEDURE:  1. Abdominal aortogram and bilateral iliac angiography via left common     femoral approach.  2. Right lower extremity angiography with second order selective     catheterization of right external iliac artery.  3. Left lower extremity angiography via left common femoral approach.  4. Cannulation of right common femoral artery with sheath.  5. PTA and stenting of severe left common iliac stenosis using an 18 mm x 8     mm Genesis stent on Opta balloon ([id number]) at 9 atmospheres for 45     seconds with second angioplasty using a 9 mm x 2 cm Powerflex catheter at     10 atmospheres for 30 seconds.   SURGEON:  Nelda Severe. Kellie Simmering, M.D.   ANESTHESIA:  Local Xylocaine and Versed 2 mg intravenously.   Heparin 4000 units.  Contrast 200 mL.   COMPLICATIONS:  None.   DESCRIPTION OF PROCEDURE:  The patient was taken to the [facility]  Peripheral Endovascular Lab, placed in the supine position at which time  both groins were prepped with Betadine solution and draped in routine  sterile manner.  After infiltration with 1% Xylocaine, the left common  femoral artery was entered percutaneously.  Guidewire passed into the  proximal iliac artery under fluoroscopic guidance.  A 5 French sheath and  dilator were passed over the guidewire.  The dilator removed.  The standard  guidewire would not traverse the left iliac stenosis, therefore a  Wholey  wire was utilized and traversed this area without difficulty.  Standard  pigtail catheter was positioned in the suprarenal aorta and flush abdominal  aortogram performed, injecting 20 mL of contrast at 20 mL per second.  This  revealed the aorta to be widely patent down to the bifurcation.  There were  two renal arteries bilaterally.  On the left side, the superior most renal  artery was slightly smaller than the lower one with the superior one having  an 80% proximal stenosis and the more dominant lower renal artery having a  90% proximal stenosis.  On the right side, the dominant renal artery was the  lower of the two and both had no significant stenosis.  Additional view of  this was obtained using the LAO projection at 15 degrees.  Catheter was  withdrawn into  the terminal aorta with bilateral iliac angiography being  performed.  There was a focal 90% left iliac stenosis originating about 2 cm  distal to the origin of the common iliac artery with the remainder of the  common internal and external iliac arteries being patent on the left.  On  the right, there was some mild plaque formation in the common iliac artery  with some mild ulceration but no significant stenosis with a widely patent  external and internal iliac artery on the right.  The pigtail catheter was  removed and using an IMA catheter the right common iliac artery was  cannulated, guidewire advanced into the right external iliac artery  and  using an internal catheter a right lower extremity angiogram performed  injecting 40 mL of contrast at 5 mL per second.  This revealed the right  common femoral, superficial femoral, profunda femoral, popliteal and tibial  vessels to appear normal in the right leg.  The end-hole catheter was  removed and it was decided to proceed with PTA and stenting of the left  common iliac stenosis.  Therefore, the 5 sheath in the left common femoral  artery was exchanged for a long 6  sheath and to protect the right iliac  system, the right common femoral artery was cannulated.  Rosen wire passed  into the aorta and a 5 sheath inserted in the right common femoral artery.  There were 4000 units of heparin given intravenously and angiogram was  performed through the sheath just below the iliac lesion to localize it  precisely.  After this, an 18 mm by 8 mm Genesis with Opta balloon system  was utilized and positioned in the left common iliac lesion and deployed at  9 atmospheres for 45 seconds.  The angioplasty catheter was removed and  retrograde angiogram performed which revealed widely patent angioplasty site  with possible need for further expansion.  Therefore a second angioplasty  was performed using a 9 mm balloon - 2 cm (Powerflex) with inflation at 10  atmospheres for 30 seconds.  Post angioplasty angiogram revealed this to be  widely patent.  The pigtail catheter was then passed up the Rosen wire from  the right side and completion angiogram performed which revealed a widely  patent bifurcation and both common iliac arteries being widely patent.  Following this, the sheath was withdrawn on the left side into the external  iliac artery and the left lower extremity angiogram performed.  The left  common femoral, superficial femoral and profunda femoral, popliteal and  tibial vessels all appeared normal.  When heparin had been reversed  appropriately, the sheaths were removed.  Adequate compression applied.  No  complications ensued.   FINDINGS:  1. Two left renal arteries with 90% stenosis of more dominant lower renal     artery and 80% stenosis of more superior left renal artery with two     widely patent right renal arteries.  2. A 90% focal left common iliac artery stenosis.  3. Normal distal runoff.  4. Procedure performed:  Successful percutaneous transluminal angioplasty     and stenting of left common iliac stenosis using an 18 x 8 mm Genesis    system  at 9 atmospheres for 45 seconds with second angioplasty done with     a 9 mm Powerflex balloon - 2 cm at 10 atmospheres for 30 seconds.  Nelda Severe Kellie Simmering, M.D.    JDL/MEDQ  D:  10/27/2002  T:  10/27/2002  Job:  [id number]

## 2010-10-05 NOTE — Discharge Summary (Signed)
[name], [name]                             ACCOUNT NO.:  [id number]   MEDICAL RECORD NO.:  [id number]                   PATIENT TYPE:  INP   LOCATION:  2030                                 FACILITY:  [facility]   PHYSICIAN:  Ivin Poot, M.D.               DATE OF BIRTH:  09/08/1940   DATE OF ADMISSION:  06/07/2002  DATE OF DISCHARGE:  06/18/2002                                 DISCHARGE SUMMARY   ADMISSION DIAGNOSIS:  Subendocardial myocardial infarction.   PAST MEDICAL HISTORY:  1. Obesity.  2. 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  3. Tobacco use x25 years, quit three months ago.   ALLERGIES:  CODEINE.   BRIEF HISTORY:  [name] is a 70 year old Caucasian man.  On the morning of  January 19 he developed sudden onset of epigastric pain.  This radiated to  the back.  It was associated with nausea, vomiting, shortness of breath and  diaphoresis.  This continued throughout the day and by the early afternoon  his family brought him to the emergency department at [facility]:  On January 19 [name] was admitted to [facility] under the care of Dr. Kirk Ruths.  On admission his EKG  revealed normal sinus rhythm with no acute ST changes.  His cardiac enzymes,  troponin elevated at 2.21, CK 319, MB 28.9.  On review of systems he  complained of some recent headaches as well as decreased memory.  Dr.  Jacalyn Lefevre plan was to proceed with a head CT to rule out any brain  pathology.  If that was negative his plan was to proceed with aspirin,  heparin, Integrilin, nitroglycerin and Zocor.  Probably as well beta  blockade.  He also planned to check lipids and BNP.   The head CT scan was performed on January 19 and it was negative for  hemorrhage.  He also underwent cerebrovascular evaluation with Doppler  studies.  This revealed no evidence of carotid artery disease bilaterally.   On the morning of January 20 Mr.  [name] underwent an urgent cardiac  catheterization due to his continued chest pain and positive enzymes despite  no definite ST changes on EKG.  His troponin continued to rise and on the  28th were 5.01, CK-MB 447/41.7.  The catheterization revealed severe two  vessel coronary artery disease including 99% stenosis of the LAD.  His  overall ejection fraction was estimated to be 50%.  A balloon pump was  placed after the catheterization due to his continued chest pain.  As his  lesions were not amenable to PCA urgent cardiac surgery consult was  requested.  Mr.  [name] was evaluated by Dr. Tharon Aquas Trigt.  After  examination of the patient, review of all available records including  catheterization films Dr. Prescott [name] agreed that proceeding with emergent  coronary  artery bypass grafting was appropriate treatment to address this  __________.  The procedure risks and benefits were discussed with [name]  and his family and they agreed to proceed.   [name] underwent the following surgical procedure by Dr. Tharon Aquas Trigt:  Coronary artery bypass grafting x4.  Grafts placed at the time of the  procedure:  Left internal mammary artery graft to the left anterior  descending artery, saphenous vein was grafted to the obtuse marginal artery,  saphenous vein was grafted to the ramus artery, saphenous vein was grafted  to the diagonal artery, vein was harvested from the right lower leg for the  vein graft.  The left leg was avoided because the patient has a left iliac  artery stenosis of 90%.  Mr.  [name] tolerated this procedure reasonably  well.  He was transferred in stable condition to the SICU.  Platelets and  FFP were given intraoperatively for coagulopathy and to cover the  Integrilin.  [name] remained hemodynamically stable in the immediate  postoperative period.  His intra balloon pump was discontinued on  postoperative day one.  He did require sedation and remained on mechanical   ventilation until postoperative day two because of delirium.  His delirium  and agitation were treated with Haldol and he was able to be extubated on  postoperative day.  He was lethargic and confused after extubation, his  mental status slowly improved over the next several days.  By the morning of  postoperative day seven, January 27, his mental status was at his baseline.   Other postoperative issues included atrial fibrillation.  He was started on  amiodarone and digoxin and Lopressor.  He converted to normal sinus rhythm  on the 25th.  Postoperative anemia.  He was transfused on January 26 for a  hemoglobin of 7.  By the morning of the 27th his hemoglobin had risen to  8.7.   By January 27, postoperative day seven, [name] had made sufficient  progress to be transferred from the intensive care unit.  He continued to  make good progress while in unit 2000.  On the morning of January 29 he  reports feeling very well.  His vital signs are stable at 155/80, he is  afebrile, oxygenating at 92% on room air.  His weight is 258.4 pounds, this  is about 8 1/2 pounds over his preoperative weight.  His heart remains in  sinus rhythm.  His lungs are clear.  He is tolerating his diet well.  His  bowel and bladder functions are within normal limits for him.  His incisions  are healing well.  His skin staples are intact in his chest and right leg.  He does continue to have 1 to 2+ edema of his right leg.  His ambulation is  improving, his pain control is adequate.  Overall he feels pretty well.  If  [name] continues making this progress it is anticipated he will be ready  for discharge home on the next 24 to 48 hours.   His laboratory studies on January 28 - CBC revealed white blood cell count  9.5, hemoglobin 9.9, hematocrit 29.4, platelets 214.  Chemistries included  sodium 133, potassium 3.1, BUN 25, creatinine 1.3, glucose 115, potassium  7.7.  CONDITION ON DISCHARGE:  Improved.    DISCHARGE INSTRUCTIONS:  Medications:  1. Enteric coated aspirin 325 mg p.o. daily.  2. Catapres TTS patch 0.2 mg.  He is to change the patch every seven  days.     It will be changed tomorrow, the 30th and will next be changed February     7.  3. Digoxin 0.125 mg p.o. daily.  4. Lopressor 25 mg p.o. b.i.d., in the a.m. and p.m.  5. Amiodarone 200 mg p.o. b.i.d, one in the a.m. and one in the p.m.  6. Lasix 40 mg p.o. daily x7 days.  7. Potassium chloride 20 mEq p.o. daily times 7 days.  8. Lipitor 10 mg p.o. q.h.s.  9. Altace 5 mg p.o. daily.   Pain management - Tylox one to two p.o. every four to six hours p.r.n. for  moderate to severe pain or Tylenol 325 mg one to two p.o. every four to six  hours p.r.n. for mild pain.   ACTIVITY:  He has been asked to refrain from any driving or heavy lifting,  pushing or pulling.  He has also been instructed to continue exercises and  daily walking.   DIET:  Low salt, low fat diet.   Wound care:  He may shower with mild soap and water.  If his incisions are  red, hot, swollen or if he has a fever greater than 101 degrees Fahrenheit  he is to call Dr. Lucianne Lei Trigt's office.   FOLLOW UP:  Home health services have been arranged for routine cardiac  surgery restorative care.  Assistance with medications is also being worked  on with his [employer] manager.   Appointments:  1. He has an appointment with the CVTS office to see the registered nurse     for skin staple removal on Thursday, February 6 at 9:30 a.m.  2. He will have an appointment to see Dr. Stanford Breed in approximately two     weeks.  This appointment will be arranged prior to his discharge.  3. He has an appointment to see Dr. Prescott [name] at the [facility] office on Friday,     March 5 at 11:00 a.m.       [name], R.N.                  Ivin Poot, M.D.    CTK/MEDQ  D:  06/17/2002  T:  06/17/2002  Job:  [id number]   cc:   Kirk Ruths, M.D. [facility]

## 2010-10-05 NOTE — Assessment & Plan Note (Signed)
[facility] OFFICE NOTE   JEROMI, ACHORD                          MRN:          [id number]  DATE:01/08/2006                            DOB:          Feb 10, 1941    Mr. Sherwin is a very pleasant gentleman who has a history of coronary  disease, status post coronary bypassing graft, peripheral vascular disease,  hypertension, hyperlipidemia.  Since I last saw him there is mild dyspnea on  exertion which is a chronic issue and unchanged.  There is no orthopnea,  PND, palpitations, pre-syncope, syncope or chest pain.  He does occasionally  have mild pedal edema.  '   MEDICATIONS:  1. Zetia 10 mg p.o. daily.  2. Lipitor 80 mg two daily.  3. Lopressor 50 mg p.o. b.i.d.  4. Altace 5 mg p.o. b.i.d.  5. Lasix 20 mg p.o. q. day.  6. Aspirin 81 mg p.o. q. day.   PHYSICAL EXAMINATION:  VITAL SIGNS:  Blood pressure 150/89.  His pulse is  56.  NECK:  Supple with no bruits.  CHEST:  Clear.  CARDIOVASCULAR:  Shows a regular rate and rhythm.  His carotid upstroke is  normal and there are no bruits.  EXTREMITIES:  Show trace edema.  There is a 2+ dorsalis pedis pulse on the  right and 1+ on the left.   His electrocardiogram today shows a sinus rhythm at a rate of 57.  The axis  is normal.  There are no ST changes noted.   DIAGNOSES:  1. Coronary artery disease status post graft.  2. History of renal artery stenosis, iliac stenosis status post      percutaneous transluminal angioplasty.  3. Hypertension.   PLAN:  Mr. Rudiger is doing well from a cardiac standpoint.  We will check a  CMET today to follow his potassium, renal function, and liver functions as  well as lipids and adjust his regimen as indicated.  His blood pressure is  mildly elevated today but he states it runs in the 120 to 130 range over 80  at home.  I have asked him to continue to tract this and we will increase  his Altace in the future if needed.   We discussed risk factor modifications  including diet and exercise.  He does not smoke.  He is complaining of some  degree of claudication in his lower extremities, left greater than right.  This is similar to the symptoms that he had prior to his percutaneous  transluminal angioplasty of his iliac stenosis.  I have asked him to return  to see  Dr. Kellie Simmering who has taken care of his vascular disease previously.  I will  see him back in 9 months.                              Denice Bors Stanford Breed, MD, [facility]    BSC/MedQ  DD:  01/08/2006  DT:  01/08/2006  Job #:  [id number]   cc:   Nelda Severe.  Kellie Simmering, [place]

## 2010-10-05 NOTE — Op Note (Signed)
NAMEURIAH, KLABUNDE                             ACCOUNT NO.:  [id number]   MEDICAL RECORD NO.:  [id number]                   PATIENT TYPE:  INP   LOCATION:  2308                                 FACILITY:  [facility]   PHYSICIAN:  Ivin Poot III, M.D.           DATE OF BIRTH:  [birth year]   DATE OF PROCEDURE:  06/08/2002  DATE OF DISCHARGE:                                 OPERATIVE REPORT   OPERATION:  Emergency coronary artery bypass grafting x4 (left IMA to LAD,  saphenous vein graft to diagonal, saphenous vein graft to ramus intermedius,  saphenous vein graft to obtuse marginal).   PREOPERATIVE DIAGNOSES:  1. Acute anterior myocardial infarction.  2. Unstable angina.  3. Preoperative intra-aortic balloon pump.  4. Severe two-vessel coronary artery disease.   POSTOPERATIVE DIAGNOSES:  1. Acute anterior myocardial infarction.  2. Unstable angina.  3. Preoperative intra-aortic balloon pump.  4. Severe two-vessel coronary artery disease.   SURGEON:  Ivin Poot, M.D.   ASSISTANT:  Marcellus Scott, P.A.-C.   ANESTHESIA:  General by Dr. Lorrene Reid.   INDICATIONS:  The patient is a 70 year old white male, ex-smoker, who  presented with left chest pain and presented to the hospital with positive  cardiac enzymes and non-specific EKG changes.  Cardiac catheterization today  by Dr. Minus Breeding demonstrated severe disease of the LAD, diagonal,  ramus, and obtuse marginal.  The LAD had a 99% TIMI-1 flow, and it was felt  that the patient was a candidate for surgical coronary revascularization.  An intra-aortic balloon pump was placed in the Cardiac Catheterization Lab  due to severe pain not relieved by medication.  I evaluated the patient in  the CCU when he was stabilized on the balloon pump.  I discussed the  indications and expected benefits of coronary bypass surgery for treatment  of his severe coronary disease with the patient and his family.  I discussed  the alternatives  to surgical therapy for treatment of his coronary disease  as well.  I reviewed with the patient and his family the major aspects of  the proposed operation, including the location of the surgical incisions,  the use of general anesthesia, and cardiopulmonary bypass, the choice of  conduit for grafting, and the expected postoperative hospital recovery.  I  discussed with the patient the risk to him of coronary artery bypass surgery  including risk of MI, CVA, bleeding, infection, blood transfusion, loss of  limb, and death.  He understood these implications for the surgery and  agreed to proceed with the operation as planned under what I felt was an  informed consent.   OPERATIVE FINDINGS:  The patient's body habitus made exposure of the heart,  especially the lateral wall where the obtuse marginal was located,  difficult.  The vein was harvested from the right leg, as the patient has a  left iliac artery stenosis of 90%.  The vein and mammary artery were of  adequate quality.  The patient was given platelet transfusion in the  operating room due to significant coagulopathy induced by his preoperative  dose of Integrilin which was still being infused when he was taken to  surgery.   PROCEDURE IN DETAIL:  The patient was brought to the operating room and  placed supine on the operating table where general anesthesia was induced  under invasive hemodynamic monitoring.  The chest, abdomen, and legs were  prepped with Betadine and draped as a sterile field.  A sternal incision was  made.  The saphenous vein was harvested from the right lower leg.  The  internal mammary artery was harvested as a pedicle graft at its origin at  the subclavian vessels.  It was a good vessel with adequate flow.  Heparin  was administered, and ACT was documented as being therapeutic.  The sternal  retractor was placed using the deep blades.  The pericardium was opened and  pursestrings placed in the ascending  aorta and right atrium.  The patient  was cannulated and placed on bypass and cooled to 32 degrees.  The  coronaries were identified for grafting, and the mammary artery and vein  grafts were prepared for the distal anastomoses.  A cardioplegic catheter  was placed for both antegrade and retrograde delivery of cold blood  cardioplegia.  The patient was then cooled to 30 degrees, and the aortic  cross clamp was applied.  A total of 800 cubic centimeters of cold blood  cardioplegia was delivered in split doses between the antegrade aortic and  retrograde coronary sinus catheters.  There was good cardioplegic arrest  with septal temperature dropping less than 14 degrees.  Topical ice saline  slush was used to augment myocardial preservation and a pericardial  insulator pad was used to protect the left phrenic nerve.   The distal coronary anastomoses were then performed.  The first distal  anastomosis was placed to the diagonal.  This was a 1.5-mm vessel, proximal  90% stenosis.  A reversed saphenous vein was sewn end to side with a running  7-0 Prolene with good flow through the graft.  The second distal anastomosis  was to the ramus intermedius.  This was a 1.5-mm vessel, proximal 95%  stenosis.  A reversed saphenous vein was sewn end to side with a running 7-0  Prolene with good flow through the graft.  The third distal anastomosis was  the obtuse marginal.  This was an intramyocardial vessel, 1.5 mm in diameter  with a proximal 80% stenosis.  A reversed saphenous vein was sewn end to  side with a running 7-0 Prolene, and there was good flow through the graft.  Cardioplegia was re-dosed.  The fourth distal anastomosis was the distal  third of the LAD which was a 1.5-mm vessel, proximal 99% stenosis.  The left  internal mammary artery pedicle was brought through an opening created in  the left lateral pericardium and was brought down onto the LAD and sewn end to side with a running 8-0  Prolene.  There was excellent flow through the  anastomosis with immediate rise in septal temperature after release of the  pedicle clamp on the mammary artery.  The mammary pedicle was secured to the  epicardium, and the aortic cross clamp was removed.   The heart resumed a spontaneous rhythm.  Under a partial-occluding clamp,  three proximal vein anastomoses were placed on the ascending aorta using a 4-  mm punch and running 6-0 Prolene.  The partial clamp was removed, and the  vein grafts were perfused.  Each had good flow, and hemostasis was  documented to the proximal and distal anastomoses.  The patient was re-  warmed and re-perfused.  Temporary pacing wires were applied.  The lungs  were re-expanded and the ventilator was resumed.  When the patient reached  37 degrees, he was weaned from bypass back on the balloon pump without  inotropes.  Protamine was administered, and there was no adverse reaction to  the protamine.  Hemodynamics remained stable, and he had good cardiac  output.  The mediastinum was irrigated with warm antibiotic irrigation.  The  leg incision was irrigated and closed in standard fashion.  The patient was  given a platelet transfusion due to coagulopathy related to preoperative  effect of Integrilin.  Two mediastinal and a left pleural chest tube were  placed and brought out through separate incisions.  The sternum was closed  with interrupted  steel wire.  The pectoralis fascia was closed with interrupted #1 Vicryl.  The subcutaneous and skin were closed with Vicryl.  Sterile dressings were  applied.  Total bypass time was 115 minutes with aortic cross-clamp time of  50 minutes.                                               Len Childs, M.D.    PV/MEDQ  D:  06/08/2002  T:  06/09/2002  Job:  [id number]   cc:   Minus Breeding, M.D. [facility]  [address] [address]  Fax: 1

## 2010-10-05 NOTE — Procedures (Signed)
NAMERONON, GESLER                 ACCOUNT NO.:  [id number]   MEDICAL RECORD NO.:  [id number]          PATIENT TYPE:  OUT   LOCATION:  SLEEP CENTER                 FACILITY:  [facility]   PHYSICIAN:  Danton Sewer, M.D. [facility] DATE OF BIRTH:  Feb 20, 1941   DATE OF STUDY:  02/26/2005                              NOCTURNAL POLYSOMNOGRAM   REFERRING PHYSICIAN:  Tamsen Roers.   DATE OF STUDY:  February 26, 2005.   INDICATION FOR STUDY:  Hypersomnia with sleep apnea.   EPWORTH SLEEPINESS SCORE:  13.   SLEEP ARCHITECTURE:  The patient had a total sleep time of 244 minutes with  very decreased slow wave sleep and REM. Sleep onset latency was prolonged at  32 minutes as was REM onset at 267 minutes. Sleep efficiency was very poor  at 61%.   RESPIRATORY DATA:  The patient was found to have 16 hypopneas and 233 apneas  for a respiratory disturbance index of 61 events per hour that occurred in  all body positions. Loud snoring was noted throughout the study.   OXYGEN DATA:  The patient had O2 desaturation as low as 83% with his  obstructive events.   CARDIAC DATA:  There were no clinically significant cardiac arrhythmias.   MOVEMENT/PARASOMNIA:  None.   IMPRESSIONS/RECOMMENDATIONS:  Severe obstructive sleep apnea with a  respiratory disturbance index of 61 events per hour and O2 desaturation as  low as 83%. Treatment for this degree of sleep apnea should primarily focus  on weight loss and C-PAP. The patient may have significant improvement but  not clinical cure with other treatment modalities including upper airway  surgery or oral appliance. Clinical correlation is suggested.           ______________________________  Danton Sewer, M.D. [facility]  Diplomate, American Board of Sleep  Medicine     KC/MEDQ  D:  03/15/2005 15:40:28  T:  03/15/2005 21:06:26  Job:  [id number]

## 2010-12-11 ENCOUNTER — Ambulatory Visit: Payer: Self-pay | Admitting: Vascular Surgery

## 2011-02-01 ENCOUNTER — Encounter: Payer: Self-pay | Admitting: Vascular Surgery

## 2011-02-05 ENCOUNTER — Ambulatory Visit: Payer: Self-pay | Admitting: Vascular Surgery

## 2011-02-05 ENCOUNTER — Other Ambulatory Visit: Payer: Self-pay

## 2011-02-28 ENCOUNTER — Other Ambulatory Visit: Payer: Self-pay | Admitting: Cardiology

## 2011-03-21 ENCOUNTER — Other Ambulatory Visit: Payer: Self-pay | Admitting: Cardiology

## 2011-07-18 ENCOUNTER — Ambulatory Visit
Admission: RE | Admit: 2011-07-18 | Discharge: 2011-07-18 | Disposition: A | Discharge status: Home / Self Care | Payer: Medicare Other | Source: Ambulatory Visit | Attending: Family Medicine | Admitting: Family Medicine

## 2011-07-18 ENCOUNTER — Other Ambulatory Visit: Payer: Self-pay | Admitting: Family Medicine

## 2011-07-18 DIAGNOSIS — M1990 Unspecified osteoarthritis, unspecified site: Secondary | ICD-10-CM

## 2011-09-24 ENCOUNTER — Encounter (HOSPITAL_COMMUNITY): Payer: Self-pay | Admitting: Respiratory Therapy

## 2011-10-08 ENCOUNTER — Ambulatory Visit (HOSPITAL_COMMUNITY)
Admission: RE | Admit: 2011-10-08 | Discharge: 2011-10-08 | Disposition: A | Discharge status: Home / Self Care | Payer: Medicare Other | Source: Ambulatory Visit | Attending: Cardiology | Admitting: Cardiology

## 2011-10-08 ENCOUNTER — Encounter (HOSPITAL_COMMUNITY)
Admission: RE | Disposition: A | Discharge status: Home / Self Care | Payer: Self-pay | Source: Ambulatory Visit | Attending: Cardiology

## 2011-10-08 DIAGNOSIS — E782 Mixed hyperlipidemia: Secondary | ICD-10-CM | POA: Insufficient documentation

## 2011-10-08 DIAGNOSIS — I25810 Atherosclerosis of coronary artery bypass graft(s) without angina pectoris: Secondary | ICD-10-CM | POA: Insufficient documentation

## 2011-10-08 DIAGNOSIS — I209 Angina pectoris, unspecified: Secondary | ICD-10-CM | POA: Insufficient documentation

## 2011-10-08 DIAGNOSIS — R0609 Other forms of dyspnea: Secondary | ICD-10-CM | POA: Insufficient documentation

## 2011-10-08 DIAGNOSIS — I10 Essential (primary) hypertension: Secondary | ICD-10-CM | POA: Insufficient documentation

## 2011-10-08 DIAGNOSIS — R0989 Other specified symptoms and signs involving the circulatory and respiratory systems: Secondary | ICD-10-CM | POA: Insufficient documentation

## 2011-10-08 DIAGNOSIS — E119 Type 2 diabetes mellitus without complications: Secondary | ICD-10-CM | POA: Insufficient documentation

## 2011-10-08 DIAGNOSIS — I2510 Atherosclerotic heart disease of native coronary artery without angina pectoris: Secondary | ICD-10-CM | POA: Insufficient documentation

## 2011-10-08 HISTORY — PX: LEFT AND RIGHT HEART CATHETERIZATION WITH CORONARY/GRAFT ANGIOGRAM: SHX5448

## 2011-10-08 LAB — GLUCOSE, CAPILLARY
Glucose-Capillary: 127 mg/dL — ABNORMAL HIGH (ref 70–99)
Glucose-Capillary: 115 mg/dL — ABNORMAL HIGH (ref 70–99)

## 2011-10-08 SURGERY — LEFT AND RIGHT HEART CATHETERIZATION WITH CORONARY/GRAFT ANGIOGRAM

## 2011-10-08 MED ORDER — ASPIRIN 81 MG PO CHEW
CHEWABLE_TABLET | ORAL | Status: AC
  Administered 2011-10-08: 324 mg via ORAL
  Filled 2011-10-08: qty 4

## 2011-10-08 MED ORDER — SODIUM CHLORIDE 0.9 % IJ SOLN: 3.0000 mL | INTRAMUSCULAR | Status: DC | PRN

## 2011-10-08 MED ORDER — SODIUM CHLORIDE 0.9 % IV SOLN: 250.0000 mL | INTRAVENOUS | Status: DC | PRN

## 2011-10-08 MED ORDER — NITROGLYCERIN 0.2 MG/ML ON CALL CATH LAB
INTRAVENOUS | Status: AC
  Filled 2011-10-08: qty 1

## 2011-10-08 MED ORDER — SODIUM CHLORIDE 0.9 % IV SOLN
INTRAVENOUS | Status: DC
  Administered 2011-10-08: 08:00:00 via INTRAVENOUS

## 2011-10-08 MED ORDER — ASPIRIN 81 MG PO CHEW
324.0000 mg | CHEWABLE_TABLET | ORAL | Status: AC
  Administered 2011-10-08: 324 mg via ORAL

## 2011-10-08 MED ORDER — HYDROMORPHONE HCL PF 2 MG/ML IJ SOLN
INTRAMUSCULAR | Status: AC
  Filled 2011-10-08: qty 1

## 2011-10-08 MED ORDER — HEPARIN (PORCINE) IN NACL 2-0.9 UNIT/ML-% IJ SOLN
INTRAMUSCULAR | Status: AC
  Filled 2011-10-08: qty 2000

## 2011-10-08 MED ORDER — LIDOCAINE HCL (PF) 1 % IJ SOLN
INTRAMUSCULAR | Status: AC
  Filled 2011-10-08: qty 30

## 2011-10-08 MED ORDER — MIDAZOLAM HCL 2 MG/2ML IJ SOLN
INTRAMUSCULAR | Status: AC
  Filled 2011-10-08: qty 2

## 2011-10-08 MED ORDER — SODIUM CHLORIDE 0.9 % IJ SOLN: 3.0000 mL | Freq: Two times a day (BID) | INTRAMUSCULAR | Status: DC

## 2011-10-08 NOTE — H&P (Signed)
  Please see paper chart  

## 2011-10-08 NOTE — Progress Notes (Signed)
Pt had swelling at (L) radial site upon getting dressed to go home.  Pressure held [employer].  Dr. [name] notified.  Pt radial area soft without further swelling.  Will monitor pt until 1615 per Dr. [name] request.

## 2011-10-08 NOTE — CV Procedure (Signed)
Procedure performed:  Left heart catheterization including hemodynamic monitoring of the left ventricle, selective right and left coronary arteriography. Saphenous graft and left IMA arteriogram. Left radial access.  Indication patient is a 71 year-old man with history of hypertension,  hyperlipidemia,  Diabetes Mellitus   who presents with worsening dyspnea and chest pain. Patient has  had non invasive testing which was normal. But recent worsening chest pain very typical for his prior angina.  Hence is brought to the cardiac catheterization lab to evaluate her coronary anatomy for definitive diagnosis of CAD.  Hemodynamic data:  Left ventricular pressure was 144/19 with LVEDP of 21 mm mercury. Aortic pressure was 143/67 with a mean of 96 mm mercury. No pressure gradient across the AV.  Left ventricle:Not performed. Only hemodynamics noted.  Right coronary artery: The vessel is smooth, mid RCA 30-40%,  Dominant. Large.  Left main coronary artery is large and distal LM 30%  Circumflex coronary artery: This was occluded in the midsegment. He gives origin to a small obtuse marginal 1.  SVG to distal circumflex: This is occluded.  LAD:  LAD gives origin to a large diagonal-1 which has ostial 99% stenosis. Distal D-1 supplied by vein graft.  LAD occluded in the mid segment and distally supplied by LIMA graft.   LIMA to LAD: This is widely patent.  SVG to D1: Widely patent. The graft is smooth and normal.  SVG to ramus intermediate: This graft is widely patent, smooth and normal.  Impression: I suspect the recent angina  is due to occlusion of a vein graft to the circumflex coronary artery. The circumflex itself is not protected now. However this no target for coronary a either through the native vessel or through the graft for angioplasty. Hence this lesion was left alone. As I utilized left radial access to perform angiography, right heart catheterization was not performed due to the  inability to obtain a 5 [place] balloontipped catheter. Due to the risks of potential complications including hematoma via femoral vein access, right heart catheterization was not performed and abandoned. And there is also reason for his dyspnea which includes new occlusion of vein graft. Continued risk factor modification, weight loss and exercise is indicated.  Technique: Under sterile precautions using a 6 French left radial  arterial access, a 6 French sheath was introduced into the left radial artery. A 5 [place] Tig 4 catheter was advanced into the ascending aorta selective  right coronary artery and angiography was performed in multiple views.  the vein grafts and cannulated using the left bypass graft catheter. AL-1 1 was also utilized. Left coronary arteriogram was performed with utilization of a Judkins left 4 diagnostic catheter which was a 5 [place] catheter. The catheter was pulled back Out of the body over exchange length J-wire.  AL1 catheter was utilized to cross the aortic valve and hemodynamics was performed.  Catheter exchanged out of the body over J-Wire. NO immediate complications noted. Patient tolerated the procedure well.    Disposition: Will be discharged home today with outpatient follow up.

## 2011-10-08 NOTE — Progress Notes (Signed)
(  L) radial site soft without swelling.  VSS discharged to home with wife,

## 2011-10-08 NOTE — Discharge Instructions (Signed)
Radial Site Care Refer to this sheet in the next few weeks. These instructions provide you with information on caring for yourself after your procedure. Your caregiver may also give you more specific instructions. Your treatment has been planned according to current medical practices, but problems sometimes occur. Call your caregiver if you have any problems or questions after your procedure. HOME CARE INSTRUCTIONS  You may shower the day after the procedure.Remove the bandage (dressing) and gently wash the site with plain soap and water.Gently pat the site dry.   Do not apply powder or lotion to the site.   Do not submerge the affected site in water for 3 to 5 days.   Inspect the site at least twice daily.   Do not flex or bend the affected arm for 24 hours.   No lifting over 5 pounds (2.3 kg) for 5 days after your procedure.   Do not drive home if you are discharged the same day of the procedure. Have someone else drive you.   You may drive 24 hours after the procedure unless otherwise instructed by your caregiver.   Do not operate machinery or power tools for 24 hours.   A responsible adult should be with you for the first 24 hours after you arrive home.  What to expect:  Any bruising will usually fade within 1 to 2 weeks.   Blood that collects in the tissue (hematoma) may be painful to the touch. It should usually decrease in size and tenderness within 1 to 2 weeks.  SEEK IMMEDIATE MEDICAL CARE IF:  You have unusual pain at the radial site.   You have redness, warmth, swelling, or pain at the radial site.   You have drainage (other than a small amount of blood on the dressing).   You have chills.   You have a fever or persistent symptoms for more than 72 hours.   You have a fever and your symptoms suddenly get worse.   Your arm becomes pale, cool, tingly, or numb.   You have heavy bleeding from the site. Hold pressure on the site.  Document Released: 06/08/2010  Document Revised: 04/25/2011 Document Reviewed: 06/08/2010 Exit[employer] Patient Information 2012 [employer]. 

## 2011-10-08 NOTE — Interval H&P Note (Signed)
History and Physical Interval Note:  10/08/2011 10:32 AM  [name]  has presented today for surgery, with the diagnosis of Chest pain  The various methods of treatment have been discussed with the patient and family. After consideration of risks, benefits and other options for treatment, the patient has consented to  Procedure(s) (LRB): LEFT AND RIGHT HEART CATHETERIZATION WITH CORONARY/GRAFT ANGIOGRAM (N/A) as a surgical intervention .  The patients' history has been reviewed, patient examined, no change in status, stable for surgery.  I have reviewed the patients' chart and labs.  Questions were answered to the patient's satisfaction.     Laverda Page

## 2011-10-09 MED FILL — Nicardipine HCl IV Soln 2.5 MG/ML: INTRAVENOUS | Qty: 1 | Status: AC

## 2012-02-03 ENCOUNTER — Encounter: Payer: Self-pay | Admitting: Vascular Surgery

## 2012-03-21 ENCOUNTER — Other Ambulatory Visit: Payer: Self-pay | Admitting: Cardiology

## 2012-12-16 ENCOUNTER — Telehealth (HOSPITAL_COMMUNITY): Payer: Self-pay | Admitting: Cardiac Rehabilitation

## 2012-12-16 NOTE — Telephone Encounter (Signed)
Pc to discuss cardiac rehab referral.  At pt request, I spoke to his wife.  Per pt wife, pt may not be able to participate in cardiac rehab activity.  Pt has severe rheumatoid arthritis which is disabling for him.  In addition, pt has significant dyspnea. Pt was started on home oxygen today.  Will call pt back in a few weeks to reassess his interest in participation.  Pt wife states she is willing to discuss program with him

## 2013-01-26 ENCOUNTER — Telehealth (HOSPITAL_COMMUNITY): Payer: Self-pay | Admitting: Cardiac Rehabilitation

## 2013-01-26 NOTE — Telephone Encounter (Signed)
Spoke to pt about enrolling in cardiac rehab program.  Pt states he is not interested at this time.  Dr [name] made aware

## 2013-03-29 ENCOUNTER — Other Ambulatory Visit: Payer: Self-pay | Admitting: Cardiology

## 2013-04-10 ENCOUNTER — Other Ambulatory Visit: Payer: Self-pay | Admitting: Cardiology

## 2013-04-21 ENCOUNTER — Other Ambulatory Visit: Payer: Self-pay | Admitting: Cardiology

## 2013-05-21 ENCOUNTER — Other Ambulatory Visit: Payer: Self-pay | Admitting: Cardiovascular Disease

## 2013-05-21 ENCOUNTER — Other Ambulatory Visit: Payer: Self-pay | Admitting: Cardiology

## 2013-06-23 ENCOUNTER — Other Ambulatory Visit: Payer: Self-pay | Admitting: Cardiology

## 2013-07-25 ENCOUNTER — Other Ambulatory Visit: Payer: Self-pay | Admitting: Cardiology

## 2013-08-11 ENCOUNTER — Other Ambulatory Visit (HOSPITAL_COMMUNITY): Payer: Self-pay | Admitting: Chiropractic Medicine

## 2013-08-11 DIAGNOSIS — N189 Chronic kidney disease, unspecified: Secondary | ICD-10-CM

## 2013-08-17 ENCOUNTER — Ambulatory Visit (HOSPITAL_COMMUNITY)
Admission: RE | Admit: 2013-08-17 | Discharge: 2013-08-17 | Disposition: A | Discharge status: Home / Self Care | Payer: Medicare Other | Source: Ambulatory Visit | Attending: Nephrology | Admitting: Nephrology

## 2013-08-17 DIAGNOSIS — I701 Atherosclerosis of renal artery: Secondary | ICD-10-CM

## 2013-08-17 DIAGNOSIS — N189 Chronic kidney disease, unspecified: Secondary | ICD-10-CM

## 2013-08-17 NOTE — Progress Notes (Signed)
*  PRELIMINARY RESULTS* Vascular Ultrasound Renal Artery Duplex has been completed.  Preliminary findings: Technically limited due to body habitus and overlying bowel gas. --No obvious renal artery stenosis noted. Left mid renal artery demonstrates turbulent waveforms with spectral broadening, possibly suggesting proximal stenosis. --Abnormal intrarenal indices bilaterally. --Left inferior pole renal cyst noted.    Landry Mellow, RDMS, RVT  08/17/2013, 11:21 AM

## 2014-01-27 ENCOUNTER — Other Ambulatory Visit: Payer: Self-pay | Admitting: Family Medicine

## 2014-01-27 DIAGNOSIS — J019 Acute sinusitis, unspecified: Secondary | ICD-10-CM

## 2014-01-28 ENCOUNTER — Other Ambulatory Visit: Payer: Medicare Other

## 2014-02-02 ENCOUNTER — Ambulatory Visit
Admission: RE | Admit: 2014-02-02 | Discharge: 2014-02-02 | Disposition: A | Discharge status: Home / Self Care | Payer: Medicare Other | Source: Ambulatory Visit | Attending: Family Medicine | Admitting: Family Medicine

## 2014-02-02 DIAGNOSIS — J019 Acute sinusitis, unspecified: Secondary | ICD-10-CM

## 2014-04-28 ENCOUNTER — Encounter (HOSPITAL_COMMUNITY): Payer: Self-pay | Admitting: Cardiology

## 2015-03-06 ENCOUNTER — Encounter: Payer: Self-pay | Admitting: Internal Medicine

## 2015-03-06 ENCOUNTER — Ambulatory Visit (INDEPENDENT_AMBULATORY_CARE_PROVIDER_SITE_OTHER): Payer: Medicare Other | Admitting: Internal Medicine

## 2015-03-06 VITALS — BP 132/70 | HR 66 | Ht 67.0 in | Wt 255.4 lb

## 2015-03-06 DIAGNOSIS — I4892 Unspecified atrial flutter: Secondary | ICD-10-CM

## 2015-03-06 DIAGNOSIS — I25709 Atherosclerosis of coronary artery bypass graft(s), unspecified, with unspecified angina pectoris: Secondary | ICD-10-CM | POA: Diagnosis not present

## 2015-03-06 DIAGNOSIS — R55 Syncope and collapse: Secondary | ICD-10-CM | POA: Insufficient documentation

## 2015-03-06 DIAGNOSIS — I483 Typical atrial flutter: Secondary | ICD-10-CM | POA: Insufficient documentation

## 2015-03-06 NOTE — Progress Notes (Signed)
Electrophysiology Office Note   Date:  03/06/2015   ID:  [name], DOB 08-29-1940, MRN [id number]  PCP:  Tamsen Roers, MD  Cardiologist:  Dr. [name] Primary Electrophysiologist: Dr. Rayann Heman    Chief Complaint  Patient presents with  .   Marland Kitchen Atrial Flutter     History of Present Illness: [name] is a 74 y.o. male who presents today for electrophysiology evaluation of Atrial Flutter.  He reports for about a year feeling like his overall exertional capacity is decreased to the point of having to rest walking uop only a couple steps, this different from being very active and able to carry things up the stairs without trouble.  He reports feeling out of breath and with some CP.  He saw his primary cardiologist after he had 2 syncopal episodes, underwent a number of tests, and referred to EP for evaluation of newly found Aflutter.  The patient does not feel palpitations or an irregularity to his heart beat.  He reports 01/27/15 having a fainting episode that was sudden and without warning, woke on the floor with a sore neck, not no obviousl head trauma or innjury otherwise, his wife was home but did not witness the event, he does not think he was out for long but cant be sure.  He had no pre-syncopal symptoms or warning, woke feeling very weak, having trouble getting up off the floor, having to hang on to furniture to get up and eventually felt better.  The second occurred 2 days later, with only a second of warning, again woke on the floor, again woke very weak.   He has not had recurrent syncope, no near syncope.  He reports a known history of "vagal" episodes, with these he has plenty of warning and is able to get seated or lying down and control the event, the 2 syncopal events were nothing like those.  He has some degree of what sounds like a chronic element of chest discomfort, better since he was started on Ranexa apparrently.  He does not know how long he has been in [place], does not  perceive any irreglar heart beats, dates a change in exertional capacity to about a year.  Today, he denies symptoms of palpitations, chest pain, shortness of breath, orthopnea, PND, lower extremity edema, claudication, dizziness, presyncope, syncope, bleeding, or neurologic sequela. The patient is tolerating medications without difficulties and is otherwise without complaint today.    Past Medical History  Diagnosis Date  . Myocardial infarction ([place])   . Hypertension   . Obesity   . Coronary artery disease     CABG x4 in 2004  . Peripheral vascular disease ([place])     Renal artery angioplast 2004  . Obstructive sleep apnea   . Hyperlipidemia   . Chronic kidney disease     Stage IV, nephrologist Dr. Sharyne Richters  . Diabetes [facility])    Past Surgical History  Procedure Laterality Date  . Coronary artery bypass graft  06/08/2002  . Anterior fusion lumbar spine    . Laminectomy    . Left and right heart catheterization with coronary/graft angiogram N/A 10/08/2011    Procedure: LEFT AND RIGHT HEART CATHETERIZATION WITH Beatrix Fetters;  Surgeon: Laverda Page, MD;  Location: [facility] CATH LAB;  Service: Cardiovascular;  Laterality: N/A;     Current Outpatient Prescriptions  Medication Sig Dispense Refill  . amLODipine (NORVASC) 5 MG tablet Take 5 mg by mouth daily.    Marland Kitchen apixaban (ELIQUIS) 5 MG TABS  tablet Take 5 mg by mouth 2 (two) times daily.    Marland Kitchen atorvastatin (LIPITOR) 80 MG tablet TAKE ONE TABLET BY MOUTH EVERY DAY 90 tablet 3  . FLUoxetine (PROZAC) 20 MG tablet Take 20 mg by mouth daily.    . furosemide (LASIX) 40 MG tablet Take 40 mg by mouth 3 (three) times daily.    Marland Kitchen lisinopril (PRINIVIL,ZESTRIL) 40 MG tablet TAKE ONE TABLET BY MOUTH ONCE DAILY 30 tablet 0  . metoprolol (LOPRESSOR) 50 MG tablet Take 50 mg by mouth 2 (two) times daily.    . nitroGLYCERIN (NITROSTAT) 0.4 MG SL tablet Place 0.4 mg under the tongue every 5 (five) minutes as needed for chest pain (up to 3 doses  max). For chest pain    . omeprazole (PRILOSEC) 20 MG capsule Take 20 mg by mouth daily.    Marland Kitchen oxyCODONE-acetaminophen (PERCOCET) 5-325 MG per tablet Take 1 tablet by mouth every 6 (six) hours as needed. For pain    . ranolazine (RANEXA) 500 MG 12 hr tablet Take 500 mg by mouth 2 (two) times daily.    . clopidogrel (PLAVIX) 75 MG tablet Take 1 tablet by mouth daily.    . isosorbide mononitrate (IMDUR) 60 MG 24 hr tablet Take 1 tablet by mouth daily.     No current facility-administered medications for this visit.    Allergies:   Codeine phosphate   Social History:  The patient  reports that he quit smoking about 13 years ago. His smoking use included Cigarettes. He quit after 25 years of use. He does not have any smokeless tobacco history on file. He reports that he does not drink alcohol.   Family History:  The patient's family history includes Arthritis in his father and mother; Hypertension in his mother.    ROS:  Please see the history of present illness.   All other systems are reviewed and negative.    PHYSICAL EXAM: VS:  BP 132/70 mmHg  Pulse 66  Ht 5\' 7"  (1.702 m)  Wt 255 lb 6.4 oz (115.849 kg)  BMI 39.99 kg/m2 , BMI Body mass index is 39.99 kg/(m^2). GEN: Obese,  in no acute distress HEENT: normal Neck: no JVD, carotid bruits, or masses Cardiac: Irregular-irregular; no significant murmurs, rubs, or gallops, no edema  Respiratory:  clear to auscultation bilaterally, normal work of breathing GI: soft, nontender, nondistended, + BS MS: no deformity or atrophy Skin: warm and dry  Neuro:  Strength and sensation are intact Psych: euthymic mood, full affect  EKG:  EKG is ordered today. The ekg ordered today shows Aflutter, appears typical, 66bpm   Recent Labs: From Dr. [name]: 02/10/15: BUN 16, Creat 1.76, K+ 4.8, Na+ 139, H/H 13.3/38.7, WBC 6.6, Plts 161    Wt Readings from Last 3 Encounters:  03/06/15 255 lb 6.4 oz (115.849 kg)  10/08/11 260 lb (117.935 kg)    03/07/10 270 lb (122.471 kg)      Other studies Reviewed: From Dr. Emilio Aspen: 02/10/15: EKG is AFlutter, 65bpm 02/21/15: Echo with EF 55%, LA 4.3cm, mild-mod dilated RA/RV, mild MR, mild TR, PA 26mmHg 02/20/15: Lexiscan stress myoview: very small sized lateral ischemia towards the apex could no be completely excluded, EF 63%, no regional wall motioni abnormalities. Low risk stress test 10/08/11: CATH: Native LAD and Cx occluded, SVG to Cx occluded (new), LIMA to LAD, SVG to D1, SVG to RI are patent (no intervention done started on ranexa)  ASSESSMENT AND PLAN:  1.  Aflutter, appears typical, new  for patient, unknown onset      Reduced exertional capacity      CHADS2vasc = at least 4 on Eliquis, counseled on importance of not missing any doses Therapeutic strategies for atrial flutter including medicine and ablation were discussed in detail with the patient today. Risk, benefits, and alternatives to EP study and radiofrequency ablation were also discussed in detail today. These risks include but are not limited to stroke, bleeding, vascular damage, tamponade, perforation, damage to the heart and other structures, AV block requiring pacemaker, worsening renal function, and death. The patient understands these risk and wishes to proceed.  We will therefore proceed with catheter ablation at the next available time.  2. Syncope x2 Abrupt episodes are worrisome.  He could have had 1:1 atrial flutter or possible AV block as the cause.  Given preserved EF, a ventricular arrhythmia would seem less likely.  We will evaluate AV conduction further with EPS (above) and proceed with atrial flutter ablation.  If the EPS is unrevealing as to the cause for syncope, I have advised implantable loop recorder placement following ablation.  Risks of this procedure were discussed and he would like to proceed.     The patient with his wife present is instructed not to drive [id number] after his last syncopal event  3.  CAD myoview reviewed If symptoms do not resolve s/p ablation, may need further evaluation by Dr [name].  4. HTN Stable No change required today   Current medicines are reviewed at length with the patient today.   The patient does not have concerns regarding his medicines.  The following changes were made today:  none  Labs/ tests ordered today include:  Orders Placed This Encounter  Procedures  . Basic metabolic panel  . CBC with Differential  . EKG 12-Lead     Army Fossa MD 03/06/2015 1:55 PM     [address]  [place] [address] [phone] (office) [phone] (fax)

## 2015-03-06 NOTE — Patient Instructions (Addendum)
Medication Instructions:  Your physician recommends that you continue on your current medications as directed. Please refer to the Current Medication list given to you today.   Labwork:  Your physician recommends that you return for lab work 04/18/15 --you do not have to be fasting   Testing/Procedures: Your physician has recommended that you have an ablation. Catheter ablation is a medical procedure used to treat some cardiac arrhythmias (irregular heartbeats). During catheter ablation, a long, thin, flexible tube is put into a blood vessel in your groin (upper thigh), or neck. This tube is called an ablation catheter. It is then guided to your heart through the blood vessel. Radio frequency waves destroy small areas of heart tissue where abnormal heartbeats may cause an arrhythmia to start. Please see the instruction sheet given to you today.  04/25/15---Please be at the hospital at 5:30am.  Check in at the [facility] to follow ablation  Follow-Up: Your physician recommends that you schedule a follow-up appointment in: 4 weeks from 04/27/15 with Dr Rayann Heman   Any Other Special Instructions Will Be Listed Below (If Applicable).

## 2015-03-07 ENCOUNTER — Encounter: Payer: Self-pay | Admitting: *Deleted

## 2015-04-18 ENCOUNTER — Other Ambulatory Visit (INDEPENDENT_AMBULATORY_CARE_PROVIDER_SITE_OTHER): Payer: Medicare Other

## 2015-04-18 DIAGNOSIS — I4892 Unspecified atrial flutter: Secondary | ICD-10-CM

## 2015-04-18 LAB — BASIC METABOLIC PANEL
BUN: 18 mg/dL (ref 7–25)
CO2: 28 mmol/L (ref 20–31)
Calcium: 8.3 mg/dL — ABNORMAL LOW (ref 8.6–10.3)
Chloride: 101 mmol/L (ref 98–110)
Creat: 1.67 mg/dL — ABNORMAL HIGH (ref 0.70–1.18)
GLUCOSE: 143 mg/dL — AB (ref 65–99)
POTASSIUM: 4.4 mmol/L (ref 3.5–5.3)
SODIUM: 137 mmol/L (ref 135–146)

## 2015-04-18 LAB — CBC WITH DIFFERENTIAL/PLATELET
BASOS ABS: 0 10*3/uL (ref 0.0–0.1)
BASOS PCT: 0 % (ref 0–1)
Eosinophils Absolute: 0.1 10*3/uL (ref 0.0–0.7)
Eosinophils Relative: 2 % (ref 0–5)
HEMATOCRIT: 36.7 % — AB (ref 39.0–52.0)
HEMOGLOBIN: 12.6 g/dL — AB (ref 13.0–17.0)
LYMPHS PCT: 28 % (ref 12–46)
Lymphs Abs: 1.4 10*3/uL (ref 0.7–4.0)
MCH: 29.6 pg (ref 26.0–34.0)
MCHC: 34.3 g/dL (ref 30.0–36.0)
MCV: 86.2 fL (ref 78.0–100.0)
MPV: 10.4 fL (ref 8.6–12.4)
Monocytes Absolute: 0.5 10*3/uL (ref 0.1–1.0)
Monocytes Relative: 11 % (ref 3–12)
NEUTROS ABS: 2.9 10*3/uL (ref 1.7–7.7)
NEUTROS PCT: 59 % (ref 43–77)
Platelets: 161 10*3/uL (ref 150–400)
RBC: 4.26 MIL/uL (ref 4.22–5.81)
RDW: 13.5 % (ref 11.5–15.5)
WBC: 4.9 10*3/uL (ref 4.0–10.5)

## 2015-04-18 NOTE — Addendum Note (Signed)
Addended by: Stephannie Peters on: 04/18/2015 08:59 AM   Modules accepted: Orders

## 2015-04-20 ENCOUNTER — Encounter (HOSPITAL_COMMUNITY): Payer: Self-pay | Admitting: Pharmacy Technician

## 2015-04-25 ENCOUNTER — Ambulatory Visit (HOSPITAL_COMMUNITY)
Admission: RE | Admit: 2015-04-25 | Discharge: 2015-04-25 | Disposition: A | Discharge status: Home / Self Care | Payer: Medicare Other | Source: Ambulatory Visit | Attending: Internal Medicine | Admitting: Internal Medicine

## 2015-04-25 ENCOUNTER — Encounter (HOSPITAL_COMMUNITY)
Admission: RE | Disposition: A | Discharge status: Home / Self Care | Payer: Medicare Other | Source: Ambulatory Visit | Attending: Internal Medicine

## 2015-04-25 ENCOUNTER — Encounter (HOSPITAL_COMMUNITY): Payer: Self-pay | Admitting: Internal Medicine

## 2015-04-25 ENCOUNTER — Ambulatory Visit (HOSPITAL_COMMUNITY): Payer: Medicare Other | Admitting: Anesthesiology

## 2015-04-25 ENCOUNTER — Encounter (HOSPITAL_COMMUNITY)
Admission: RE | Disposition: A | Discharge status: Home / Self Care | Payer: Self-pay | Source: Ambulatory Visit | Attending: Internal Medicine

## 2015-04-25 ENCOUNTER — Ambulatory Visit (HOSPITAL_COMMUNITY): Admit: 2015-04-25 | Payer: Self-pay | Admitting: Internal Medicine

## 2015-04-25 DIAGNOSIS — R55 Syncope and collapse: Secondary | ICD-10-CM | POA: Diagnosis present

## 2015-04-25 DIAGNOSIS — I483 Typical atrial flutter: Secondary | ICD-10-CM | POA: Diagnosis present

## 2015-04-25 DIAGNOSIS — G4733 Obstructive sleep apnea (adult) (pediatric): Secondary | ICD-10-CM | POA: Insufficient documentation

## 2015-04-25 DIAGNOSIS — I252 Old myocardial infarction: Secondary | ICD-10-CM | POA: Diagnosis not present

## 2015-04-25 DIAGNOSIS — I4892 Unspecified atrial flutter: Secondary | ICD-10-CM | POA: Diagnosis not present

## 2015-04-25 DIAGNOSIS — Z7901 Long term (current) use of anticoagulants: Secondary | ICD-10-CM | POA: Insufficient documentation

## 2015-04-25 DIAGNOSIS — Z7902 Long term (current) use of antithrombotics/antiplatelets: Secondary | ICD-10-CM | POA: Diagnosis not present

## 2015-04-25 DIAGNOSIS — I2510 Atherosclerotic heart disease of native coronary artery without angina pectoris: Secondary | ICD-10-CM | POA: Insufficient documentation

## 2015-04-25 DIAGNOSIS — E785 Hyperlipidemia, unspecified: Secondary | ICD-10-CM | POA: Insufficient documentation

## 2015-04-25 DIAGNOSIS — Z951 Presence of aortocoronary bypass graft: Secondary | ICD-10-CM | POA: Insufficient documentation

## 2015-04-25 DIAGNOSIS — E1122 Type 2 diabetes mellitus with diabetic chronic kidney disease: Secondary | ICD-10-CM | POA: Diagnosis not present

## 2015-04-25 DIAGNOSIS — Z79899 Other long term (current) drug therapy: Secondary | ICD-10-CM | POA: Insufficient documentation

## 2015-04-25 DIAGNOSIS — Z87891 Personal history of nicotine dependence: Secondary | ICD-10-CM | POA: Diagnosis not present

## 2015-04-25 DIAGNOSIS — N184 Chronic kidney disease, stage 4 (severe): Secondary | ICD-10-CM | POA: Insufficient documentation

## 2015-04-25 DIAGNOSIS — Z6837 Body mass index (BMI) 37.0-37.9, adult: Secondary | ICD-10-CM | POA: Diagnosis not present

## 2015-04-25 DIAGNOSIS — E669 Obesity, unspecified: Secondary | ICD-10-CM | POA: Insufficient documentation

## 2015-04-25 DIAGNOSIS — I129 Hypertensive chronic kidney disease with stage 1 through stage 4 chronic kidney disease, or unspecified chronic kidney disease: Secondary | ICD-10-CM | POA: Insufficient documentation

## 2015-04-25 HISTORY — PX: EP IMPLANTABLE DEVICE: SHX172B

## 2015-04-25 HISTORY — PX: ELECTROPHYSIOLOGIC STUDY: SHX172A

## 2015-04-25 LAB — BASIC METABOLIC PANEL
ANION GAP: 9 (ref 5–15)
BUN: 20 mg/dL (ref 6–20)
CALCIUM: 8.2 mg/dL — AB (ref 8.9–10.3)
CHLORIDE: 104 mmol/L (ref 101–111)
CO2: 29 mmol/L (ref 22–32)
Creatinine, Ser: 1.83 mg/dL — ABNORMAL HIGH (ref 0.61–1.24)
GFR calc non Af Amer: 34 mL/min — ABNORMAL LOW (ref >60)
GFR, EST AFRICAN AMERICAN: 40 mL/min — AB (ref >60)
Glucose, Bld: 170 mg/dL — ABNORMAL HIGH (ref 65–99)
Potassium: 4.1 mmol/L (ref 3.5–5.1)
Sodium: 142 mmol/L (ref 135–145)

## 2015-04-25 LAB — GLUCOSE, CAPILLARY
Glucose-Capillary: 163 mg/dL — ABNORMAL HIGH (ref 65–99)
Glucose-Capillary: 140 mg/dL — ABNORMAL HIGH (ref 65–99)

## 2015-04-25 SURGERY — A-FLUTTER/A-TACH/SVT ABLATION

## 2015-04-25 SURGERY — A-FLUTTER/A-TACH/SVT ABLATION
Anesthesia: Monitor Anesthesia Care

## 2015-04-25 MED ORDER — FLUOXETINE HCL 20 MG PO CAPS: 20.0000 mg | ORAL_CAPSULE | Freq: Every day | ORAL | Status: DC

## 2015-04-25 MED ORDER — SODIUM CHLORIDE 0.9 % IJ SOLN: 3.0000 mL | INTRAMUSCULAR | Status: DC | PRN

## 2015-04-25 MED ORDER — BUPIVACAINE HCL (PF) 0.25 % IJ SOLN
INTRAMUSCULAR | Status: AC
  Filled 2015-04-25: qty 30

## 2015-04-25 MED ORDER — FENTANYL CITRATE (PF) 100 MCG/2ML IJ SOLN
INTRAMUSCULAR | Status: DC | PRN
  Administered 2015-04-25 (×2): 50 ug via INTRAVENOUS

## 2015-04-25 MED ORDER — ONDANSETRON HCL 4 MG/2ML IJ SOLN: 4.0000 mg | Freq: Four times a day (QID) | INTRAMUSCULAR | Status: DC | PRN

## 2015-04-25 MED ORDER — PROPOFOL 500 MG/50ML IV EMUL
INTRAVENOUS | Status: DC | PRN
  Administered 2015-04-25: 25 ug/kg/min via INTRAVENOUS

## 2015-04-25 MED ORDER — PANTOPRAZOLE SODIUM 40 MG PO TBEC
40.0000 mg | DELAYED_RELEASE_TABLET | Freq: Every day | ORAL | Status: DC
  Filled 2015-04-25: qty 1

## 2015-04-25 MED ORDER — FLUOXETINE HCL 20 MG PO TABS
20.0000 mg | ORAL_TABLET | Freq: Every day | ORAL | Status: DC
  Filled 2015-04-25 (×2): qty 1

## 2015-04-25 MED ORDER — LISINOPRIL 40 MG PO TABS: 40.0000 mg | ORAL_TABLET | Freq: Two times a day (BID) | ORAL | Status: DC

## 2015-04-25 MED ORDER — MIDAZOLAM HCL 5 MG/5ML IJ SOLN
INTRAMUSCULAR | Status: DC | PRN
  Administered 2015-04-25 (×2): 1 mg via INTRAVENOUS

## 2015-04-25 MED ORDER — METOPROLOL TARTRATE 50 MG PO TABS: 25.0000 mg | ORAL_TABLET | Freq: Two times a day (BID) | ORAL | Status: DC

## 2015-04-25 MED ORDER — SODIUM CHLORIDE 0.9 % IV SOLN: 250.0000 mL | INTRAVENOUS | Status: DC | PRN

## 2015-04-25 MED ORDER — RANOLAZINE ER 500 MG PO TB12: 500.0000 mg | ORAL_TABLET | Freq: Every day | ORAL | Status: DC

## 2015-04-25 MED ORDER — ACETAMINOPHEN 325 MG PO TABS: 650.0000 mg | ORAL_TABLET | ORAL | Status: DC | PRN

## 2015-04-25 MED ORDER — AMLODIPINE BESYLATE 5 MG PO TABS: 5.0000 mg | ORAL_TABLET | Freq: Every day | ORAL | Status: DC

## 2015-04-25 MED ORDER — APIXABAN 5 MG PO TABS
5.0000 mg | ORAL_TABLET | Freq: Two times a day (BID) | ORAL | Status: DC
  Filled 2015-04-25: qty 1

## 2015-04-25 MED ORDER — SODIUM CHLORIDE 0.9 % IV SOLN
INTRAVENOUS | Status: DC
  Administered 2015-04-25: 07:00:00 via INTRAVENOUS
  Administered 2015-04-25: 1000 mL via INTRAVENOUS

## 2015-04-25 MED ORDER — BUPIVACAINE HCL (PF) 0.25 % IJ SOLN
INTRAMUSCULAR | Status: DC | PRN
  Administered 2015-04-25: 20 mL

## 2015-04-25 MED ORDER — LIDOCAINE-EPINEPHRINE 1 %-1:100000 IJ SOLN
INTRAMUSCULAR | Status: AC
  Filled 2015-04-25: qty 1

## 2015-04-25 MED ORDER — SODIUM CHLORIDE 0.9 % IJ SOLN: 3.0000 mL | Freq: Two times a day (BID) | INTRAMUSCULAR | Status: DC

## 2015-04-25 MED ORDER — LACTATED RINGERS IV SOLN: INTRAVENOUS | Status: DC | PRN

## 2015-04-25 MED ORDER — CLOPIDOGREL BISULFATE 75 MG PO TABS: 75.0000 mg | ORAL_TABLET | Freq: Every day | ORAL | Status: DC

## 2015-04-25 MED ORDER — ONDANSETRON HCL 4 MG/2ML IJ SOLN
INTRAMUSCULAR | Status: DC | PRN
  Administered 2015-04-25: 4 mg via INTRAVENOUS

## 2015-04-25 MED ORDER — OXYCODONE-ACETAMINOPHEN 5-325 MG PO TABS: 1.0000 | ORAL_TABLET | Freq: Four times a day (QID) | ORAL | Status: DC | PRN

## 2015-04-25 MED ORDER — FUROSEMIDE 40 MG PO TABS: 40.0000 mg | ORAL_TABLET | Freq: Three times a day (TID) | ORAL | Status: DC

## 2015-04-25 MED ORDER — ISOSORBIDE MONONITRATE ER 60 MG PO TB24: 60.0000 mg | ORAL_TABLET | Freq: Every day | ORAL | Status: DC

## 2015-04-25 SURGICAL SUPPLY — 15 items
BAG SNAP BAND KOVER 36X36 (MISCELLANEOUS) ×3 IMPLANT
BLANKET WARM UNDERBOD FULL ACC (MISCELLANEOUS) ×3 IMPLANT
CATH BLAZERPRIME XP LG CV 10MM (ABLATOR) ×3 IMPLANT
CATH DUODECA/ISMUS 7FR REPROC (CATHETERS) ×3 IMPLANT
CATH JOSEPH QUAD ALLRED 6F REP (CATHETERS) ×3 IMPLANT
CATH WEB BIDIR CS D-F NONAUTO (CATHETERS) ×3 IMPLANT
LOOP REVEAL LINQSYS (Prosthesis & Implant Heart) ×3 IMPLANT
PACK EP LATEX FREE (CUSTOM PROCEDURE TRAY) ×2
PACK EP LF (CUSTOM PROCEDURE TRAY) ×1 IMPLANT
PACK LOOP INSERTION (CUSTOM PROCEDURE TRAY) ×3 IMPLANT
PAD DEFIB LIFELINK (PAD) ×3 IMPLANT
SHEATH PINNACLE 6F 10CM (SHEATH) IMPLANT
SHEATH PINNACLE 7F 10CM (SHEATH) ×6 IMPLANT
SHEATH PINNACLE 8F 10CM (SHEATH) ×3 IMPLANT
SHIELD RADPAD SCOOP 12X17 (MISCELLANEOUS) ×3 IMPLANT

## 2015-04-25 NOTE — Progress Notes (Signed)
Site area: rt groin   Site Prior to Removal:  Level 0 Pressure Applied For:  15 minutes Manual:   yes Patient Status During Pull:  stable Post Pull Site:  Level  0 Post Pull Instructions Given:  yes Post Pull Pulses Present: yes Dressing Applied:  Small tegaderm Bedrest begins @  0945 Comments: 3 rt fv sheaths removed; IV saline locked

## 2015-04-25 NOTE — Transfer of Care (Signed)
Immediate Anesthesia Transfer of Care Note  Patient: [name]  Procedure(s) Performed: Procedure(s): A-Flutter  Ablation (N/A) Loop Recorder Insertion (N/A)  Patient Location: PACU  Anesthesia Type:MAC  Level of Consciousness: awake, alert  and oriented  Airway & Oxygen Therapy: Patient Spontanous Breathing and Patient connected to nasal cannula oxygen  Post-op Assessment: Report given to RN and Post -op Vital signs reviewed and stable  Post vital signs: Reviewed and stable  Last Vitals:  Filed Vitals:   04/25/15 0615  BP: 151/82  Pulse: 57  Temp: 36.4 C  Resp: 20    Complications: No apparent anesthesia complications

## 2015-04-25 NOTE — Anesthesia Postprocedure Evaluation (Signed)
Anesthesia Post Note  Patient: Ott Esse  Procedure(s) Performed: Procedure(s) (LRB): A-Flutter  Ablation (N/A) Loop Recorder Insertion (N/A)  Patient location during evaluation: Cath Lab Anesthesia Type: MAC Level of consciousness: awake and alert, oriented and patient cooperative Pain management: pain level controlled Vital Signs Assessment: post-procedure vital signs reviewed and stable Respiratory status: spontaneous breathing, nonlabored ventilation, respiratory function stable and patient connected to nasal cannula oxygen Cardiovascular status: blood pressure returned to baseline and stable Postop Assessment: no signs of nausea or vomiting Anesthetic complications: no    Last Vitals:  Filed Vitals:   04/25/15 0940 04/25/15 0945  BP: 145/63 142/65  Pulse: 70 78  Temp:    Resp: 12 9    Last Pain: There were no vitals filed for this visit.               Midge Minium

## 2015-04-25 NOTE — Progress Notes (Addendum)
Doing well s/p ablation Ambulatory Vitals are stable Creatinine is elevated.  Pt instructed to follow-up with his nephrologist.  Discharge to home Importance of compliance with eliquis stressed to patient Wound check in 10 days Follow-up with me in 4 weeks  Thompson Grayer MD, [facility] 04/25/2015 4:50 PM

## 2015-04-25 NOTE — H&P (Signed)
Chief Complaint  Patient presents with  .   Marland Kitchen Atrial Flutter    History of Present Illness: [name] is a 74 y.o. male who presents today for EPS and RFA for atrial flutter. He reports for about a year feeling like his overall exertional capacity is decreased to the point of having to rest walking uop only a couple steps, this different from being very active and able to carry things up the stairs without trouble. He reports feeling out of breath and with some CP. He saw his primary cardiologist after he had 2 syncopal episodes, underwent a number of tests, and referred to EP for evaluation of newly found Aflutter. The patient does not feel palpitations or an irregularity to his heart beat. He reports 01/27/15 having a fainting episode that was sudden and without warning, woke on the floor with a sore neck, not no obviousl head trauma or innjury otherwise, his wife was home but did not witness the event, he does not think he was out for long but cant be sure. He had no pre-syncopal symptoms or warning, woke feeling very weak, having trouble getting up off the floor, having to hang on to furniture to get up and eventually felt better. The second occurred 2 days later, with only a second of warning, again woke on the floor, again woke very weak. He has not had recurrent syncope, no near syncope. He reports a known history of "vagal" episodes, with these he has plenty of warning and is able to get seated or lying down and control the event, the 2 syncopal events were nothing like those.  He has some degree of what sounds like a chronic element of chest discomfort, better since he was started on Ranexa apparrently. He does not know how long he has been in [place], does not perceive any irreglar heart beats, dates a change in exertional capacity to about a year.  Today, he denies symptoms of palpitations, chest pain, shortness of breath, orthopnea, PND, lower extremity edema,  claudication, dizziness, presyncope, syncope, bleeding, or neurologic sequela. The patient is tolerating medications without difficulties and is otherwise without complaint today.    Past Medical History  Diagnosis Date  . Myocardial infarction ([place])   . Hypertension   . Obesity   . Coronary artery disease     CABG x4 in 2004  . Peripheral vascular disease ([place])     Renal artery angioplast 2004  . Obstructive sleep apnea   . Hyperlipidemia   . Chronic kidney disease     Stage IV, nephrologist Dr. Sharyne Richters  . Diabetes [facility])    Past Surgical History  Procedure Laterality Date  . Coronary artery bypass graft  06/08/2002  . Anterior fusion lumbar spine    . Laminectomy    . Left and right heart catheterization with coronary/graft angiogram N/A 10/08/2011    Procedure: LEFT AND RIGHT HEART CATHETERIZATION WITH Beatrix Fetters; Surgeon: Laverda Page, MD; Location: [facility] CATH LAB; Service: Cardiovascular; Laterality: N/A;     Current Outpatient Prescriptions  Medication Sig Dispense Refill  . amLODipine (NORVASC) 5 MG tablet Take 5 mg by mouth daily.    Marland Kitchen apixaban (ELIQUIS) 5 MG TABS tablet Take 5 mg by mouth 2 (two) times daily.    Marland Kitchen atorvastatin (LIPITOR) 80 MG tablet TAKE ONE TABLET BY MOUTH EVERY DAY 90 tablet 3  . FLUoxetine (PROZAC) 20 MG tablet Take 20 mg by mouth daily.    . furosemide (LASIX) 40 MG tablet  Take 40 mg by mouth 3 (three) times daily.    Marland Kitchen lisinopril (PRINIVIL,ZESTRIL) 40 MG tablet TAKE ONE TABLET BY MOUTH ONCE DAILY 30 tablet 0  . metoprolol (LOPRESSOR) 50 MG tablet Take 50 mg by mouth 2 (two) times daily.    . nitroGLYCERIN (NITROSTAT) 0.4 MG SL tablet Place 0.4 mg under the tongue every 5 (five) minutes as needed for chest pain (up to 3 doses max). For chest pain    . omeprazole (PRILOSEC) 20 MG capsule Take 20 mg by mouth daily.     Marland Kitchen oxyCODONE-acetaminophen (PERCOCET) 5-325 MG per tablet Take 1 tablet by mouth every 6 (six) hours as needed. For pain    . ranolazine (RANEXA) 500 MG 12 hr tablet Take 500 mg by mouth 2 (two) times daily.    . clopidogrel (PLAVIX) 75 MG tablet Take 1 tablet by mouth daily.    . isosorbide mononitrate (IMDUR) 60 MG 24 hr tablet Take 1 tablet by mouth daily.     No current facility-administered medications for this visit.    Allergies: Codeine phosphate   Social History: The patient  reports that he quit smoking about 13 years ago. His smoking use included Cigarettes. He quit after 25 years of use. He does not have any smokeless tobacco history on file. He reports that he does not drink alcohol.   Family History: The patient's family history includes Arthritis in his father and mother; Hypertension in his mother.    ROS: Please see the history of present illness. All other systems are reviewed and negative.    PHYSICAL EXAM: VS: BP 132/70 mmHg  Pulse 66  Ht 5\' 7"  (1.702 m)  Wt 255 lb 6.4 oz (115.849 kg)  BMI 39.99 kg/m2 , BMI Body mass index is 39.99 kg/(m^2). GEN: Obese, in no acute distress  HEENT: normal  Neck: no JVD, carotid bruits, or masses Cardiac: Irregular-irregular; no significant murmurs, rubs, or gallops, no edema  Respiratory: clear to auscultation bilaterally, normal work of breathing GI: soft, nontender, nondistended, + BS MS: no deformity or atrophy  Skin: warm and dry  Neuro: Strength and sensation are intact Psych: euthymic mood, full affect  Wt Readings from Last 3 Encounters:  03/06/15 255 lb 6.4 oz (115.849 kg)  10/08/11 260 lb (117.935 kg)  03/07/10 270 lb (122.471 kg)     Other studies Reviewed: From Dr. Emilio Aspen: 02/10/15: EKG is AFlutter, 65bpm 02/21/15: Echo with EF 55%, LA 4.3cm, mild-mod dilated RA/RV, mild MR, mild TR, PA 38mmHg 02/20/15: Lexiscan stress myoview: very small sized lateral ischemia  towards the apex could no be completely excluded, EF 63%, no regional wall motioni abnormalities. Low risk stress test 10/08/11: CATH: Native LAD and Cx occluded, SVG to Cx occluded (new), LIMA to LAD, SVG to D1, SVG to RI are patent (no intervention done started on ranexa)  ASSESSMENT AND PLAN:  1. Aflutter, appears typical, new for patient, unknown onset  Reduced exertional capacity  CHADS2vasc = at least 4 on Eliquis, counseled on importance of not missing any doses Therapeutic strategies for atrial flutter including medicine and ablation were discussed in detail with the patient today. Risk, benefits, and alternatives to EP study and radiofrequency ablation were also discussed in detail today. These risks include but are not limited to stroke, bleeding, vascular damage, tamponade, perforation, damage to the heart and other structures, AV block requiring pacemaker, worsening renal function, and death. The patient understands these risk and wishes to proceed.  He reports compliance with eliquis  without interruption for at least 4 weeks.  2. Syncope x2 Abrupt episodes are worrisome. He could have had 1:1 atrial flutter or possible AV block as the cause. Given preserved EF, a ventricular arrhythmia would seem less likely. We will evaluate AV conduction further with EPS (above) and proceed with atrial flutter ablation. If the EPS is unrevealing as to the cause for syncope, I have advised implantable loop recorder placement following ablation. Risks of this procedure were discussed and he would like to proceed.  3. CAD myoview reviewed If symptoms do not resolve s/p ablation, may need further evaluation by Dr [name].  Thompson Grayer MD, [facility] 04/25/2015 7:12 AM

## 2015-04-25 NOTE — Discharge Instructions (Addendum)
Information on my medicine - ELIQUIS (apixaban)  This medication education was reviewed with me or my healthcare representative as part of my discharge preparation.  The pharmacist that spoke with me during my hospital stay was:  Romona Curls, [facility]  Why was Eliquis prescribed for you? Eliquis was prescribed for you to reduce the risk of a blood clot forming that can cause a stroke if you have a medical condition called atrial fibrillation (a type of irregular heartbeat).  What do You need to know about Eliquis ? Take your Eliquis TWICE DAILY - one tablet in the morning and one tablet in the evening with or without food. If you have difficulty swallowing the tablet whole please discuss with your pharmacist how to take the medication safely.  Take Eliquis exactly as prescribed by your doctor and DO NOT stop taking Eliquis without talking to the doctor who prescribed the medication.  Stopping may increase your risk of developing a stroke.  Refill your prescription before you run out.  After discharge, you should have regular check-up appointments with your healthcare provider that is prescribing your Eliquis.  In the future your dose may need to be changed if your kidney function or weight changes by a significant amount or as you get older.  What do you do if you miss a dose? If you miss a dose, take it as soon as you remember on the same day and resume taking twice daily.  Do not take more than one dose of ELIQUIS at the same time to make up a missed dose.  Important Safety Information A possible side effect of Eliquis is bleeding. You should call your healthcare provider right away if you experience any of the following: ? Bleeding from an injury or your nose that does not stop. ? Unusual colored urine (red or dark brown) or unusual colored stools (red or black). ? Unusual bruising for unknown reasons. ? A serious fall or if you hit your head (even if there is no bleeding).  Some  medicines may interact with Eliquis and might increase your risk of bleeding or clotting while on Eliquis. To help avoid this, consult your healthcare provider or pharmacist prior to using any new prescription or non-prescription medications, including herbals, vitamins, non-steroidal anti-inflammatory drugs (NSAIDs) and supplements.  This website has more information on Eliquis (apixaban): http://www.eliquis.com/eliquis/home        No lifting over 5 lbs for 1 week. No sexual activity for 1 week.  Keep procedure site clean & dry. If you notice increased pain, swelling, bleeding or pus, call/return!  You may shower, but no soaking baths/hot tubs/pools for 1 week.

## 2015-04-25 NOTE — Anesthesia Preprocedure Evaluation (Addendum)
Anesthesia Evaluation  Patient identified by MRN, date of birth, ID band Patient awake    Reviewed: Allergy & Precautions, NPO status , Patient's Chart, lab work & pertinent test results  History of Anesthesia Complications Negative for: history of anesthetic complications  Airway Mallampati: II  TM Distance: >3 FB Neck ROM: Full    Dental  (+) Dental Advisory Given   Pulmonary sleep apnea and Continuous Positive Airway Pressure Ventilation , former smoker,    breath sounds clear to auscultation       Cardiovascular hypertension, Pt. on medications and Pt. on home beta blockers (-) angina (last chest pain a week ago)+ CAD, + Past MI and + CABG (grafts patent)   Rhythm:Regular Rate:Normal  10/16 stress: preserved LVF, small apical ischemia possible   Neuro/Psych negative neurological ROS     GI/Hepatic Neg liver ROS, GERD  Controlled and Medicated,  Endo/Other  diabetes, Oral Hypoglycemic AgentsMorbid obesity  Renal/GU Renal InsufficiencyRenal disease (creat 1.67)     Musculoskeletal   Abdominal (+) + obese,   Peds  Hematology  (+) Blood dyscrasia (eliquis), ,   Anesthesia Other Findings   Reproductive/Obstetrics                            Anesthesia Physical Anesthesia Plan  ASA: III  Anesthesia Plan: MAC   Post-op Pain Management:    Induction: Intravenous  Airway Management Planned: Natural Airway and Simple Face Mask  Additional Equipment: Arterial line  Intra-op Plan:   Post-operative Plan:   Informed Consent: I have reviewed the patients History and Physical, chart, labs and discussed the procedure including the risks, benefits and alternatives for the proposed anesthesia with the patient or authorized representative who has indicated his/her understanding and acceptance.   Dental advisory given  Plan Discussed with: Surgeon and CRNA  Anesthesia Plan Comments: (Plan  routine monitors, A line, MAC)        Anesthesia Quick Evaluation

## 2015-04-26 MED FILL — Lidocaine Inj 1% w/ Epinephrine-1:100000: INTRAMUSCULAR | Qty: 20 | Status: AC

## 2015-05-25 ENCOUNTER — Ambulatory Visit (INDEPENDENT_AMBULATORY_CARE_PROVIDER_SITE_OTHER): Payer: Medicare Other | Admitting: *Deleted

## 2015-05-25 DIAGNOSIS — R55 Syncope and collapse: Secondary | ICD-10-CM | POA: Diagnosis not present

## 2015-05-29 ENCOUNTER — Encounter: Payer: Medicare Other | Admitting: Internal Medicine

## 2015-05-31 NOTE — Progress Notes (Signed)
Carelink Summary Report / Loop Recorder 

## 2015-06-07 ENCOUNTER — Encounter: Payer: Self-pay | Admitting: Internal Medicine

## 2015-06-07 ENCOUNTER — Ambulatory Visit (INDEPENDENT_AMBULATORY_CARE_PROVIDER_SITE_OTHER): Payer: Medicare Other | Admitting: Internal Medicine

## 2015-06-07 ENCOUNTER — Other Ambulatory Visit: Payer: Medicare Other

## 2015-06-07 VITALS — BP 112/78 | HR 83 | Ht 67.0 in | Wt 223.0 lb

## 2015-06-07 DIAGNOSIS — I10 Essential (primary) hypertension: Secondary | ICD-10-CM

## 2015-06-07 DIAGNOSIS — I483 Typical atrial flutter: Secondary | ICD-10-CM

## 2015-06-07 LAB — CBC WITH DIFFERENTIAL/PLATELET
BASOS PCT: 0 % (ref 0–1)
Basophils Absolute: 0 10*3/uL (ref 0.0–0.1)
EOS ABS: 0.1 10*3/uL (ref 0.0–0.7)
Eosinophils Relative: 1 % (ref 0–5)
HCT: 42 % (ref 39.0–52.0)
Hemoglobin: 14.3 g/dL (ref 13.0–17.0)
LYMPHS ABS: 1.2 10*3/uL (ref 0.7–4.0)
Lymphocytes Relative: 19 % (ref 12–46)
MCH: 29.5 pg (ref 26.0–34.0)
MCHC: 34 g/dL (ref 30.0–36.0)
MCV: 86.6 fL (ref 78.0–100.0)
MONOS PCT: 8 % (ref 3–12)
MPV: 10.8 fL (ref 8.6–12.4)
Monocytes Absolute: 0.5 10*3/uL (ref 0.1–1.0)
NEUTROS ABS: 4.4 10*3/uL (ref 1.7–7.7)
NEUTROS PCT: 72 % (ref 43–77)
PLATELETS: 167 10*3/uL (ref 150–400)
RBC: 4.85 MIL/uL (ref 4.22–5.81)
RDW: 15.1 % (ref 11.5–15.5)
WBC: 6.1 10*3/uL (ref 4.0–10.5)

## 2015-06-07 LAB — BASIC METABOLIC PANEL
BUN: 26 mg/dL — ABNORMAL HIGH (ref 7–25)
CHLORIDE: 103 mmol/L (ref 98–110)
CO2: 24 mmol/L (ref 20–31)
Calcium: 9 mg/dL (ref 8.6–10.3)
Creat: 1.82 mg/dL — ABNORMAL HIGH (ref 0.70–1.18)
Glucose, Bld: 180 mg/dL — ABNORMAL HIGH (ref 65–99)
POTASSIUM: 4.2 mmol/L (ref 3.5–5.3)
SODIUM: 139 mmol/L (ref 135–146)

## 2015-06-07 NOTE — Patient Instructions (Addendum)
Medication Instructions:  Your physician recommends that you continue on your current medications as directed. Please refer to the Current Medication list given to you today.   Labwork: Your physician recommends that you return for lab work today: BMP/CBC  Testing/Procedures: Your physician has recommended that you have an ablation. Catheter ablation is a medical procedure used to treat some cardiac arrhythmias (irregular heartbeats). During catheter ablation, a long, thin, flexible tube is put into a blood vessel in your groin (upper thigh), or neck. This tube is called an ablation catheter. It is then guided to your heart through the blood vessel. Radio frequency waves destroy small areas of heart tissue where abnormal heartbeats may cause an arrhythmia to start. Please see the instruction sheet given to you today.   Please report to the [facility] on 01/26//17 at 5:30am.  Do not eat or drink after midnight the night before and do not take any medications the morning of procedure   Follow-Up: Your physician recommends that you schedule a follow-up appointment 4 weeks from 06/15/15 with Dr Rayann Heman   Any Other Special Instructions Will Be Listed Below (If Applicable).     If you need a refill on your cardiac medications before your next appointment, please call your pharmacy.

## 2015-06-08 ENCOUNTER — Telehealth: Payer: Self-pay | Admitting: Physician Assistant

## 2015-06-08 NOTE — Telephone Encounter (Signed)
    Patient was alerted that he had an 8 second pause over night. Wife called after hours pager very concerned that his heart was going to stop and that he needed a pacemaker tonight. I spoke with Dr. [name] who had spoke to Dr. Rayann Heman. The plan is to monitor him off his BB. I explained this to the patient and reassured her.    Angelena Form PA-C  [facility]

## 2015-06-12 ENCOUNTER — Telehealth: Payer: Self-pay | Admitting: Internal Medicine

## 2015-06-12 NOTE — Telephone Encounter (Signed)
New message     Patient having a ablation on Thursday with Dr. Rayann Heman wants to know has the insurance been approve.

## 2015-06-12 NOTE — Progress Notes (Signed)
Electrophysiology Office Note   Date:  06/12/2015   ID:  [name], DOB 18-Oct-1940, MRN [id number]  PCP:  Tamsen Roers, MD  Cardiologist:  Dr. [name] Primary Electrophysiologist: Dr. Rayann Heman    Chief Complaint  Patient presents with  .   Marland Kitchen Atrial Flutter     History of Present Illness: [name] is a 75 y.o. male who presents today for electrophysiology follow-up of Atrial Flutter. He recently underwent atrial flutter ablation by me 04/25/15.  He did well with the procedure but unfortunately now returns with typical appearing atrial flutter.  He continues to have reduced exercise tolerance.  He has had no further syncope.  Denies procedure related complications.  Today, he denies symptoms of palpitations, chest pain, shortness of breath, orthopnea, PND, lower extremity edema, claudication, dizziness, presyncope, syncope, bleeding, or neurologic sequela. The patient is tolerating medications without difficulties and is otherwise without complaint today.    Past Medical History  Diagnosis Date  . Myocardial infarction ([place])   . Hypertension   . Obesity   . Coronary artery disease     CABG x4 in 2004  . Peripheral vascular disease ([place])     Renal artery angioplast 2004  . Obstructive sleep apnea   . Hyperlipidemia   . Chronic kidney disease     Stage IV, nephrologist Dr. Sharyne Richters  . Diabetes [facility])    Past Surgical History  Procedure Laterality Date  . Coronary artery bypass graft  06/08/2002  . Anterior fusion lumbar spine    . Laminectomy    . Left and right heart catheterization with coronary/graft angiogram N/A 10/08/2011    Procedure: LEFT AND RIGHT HEART CATHETERIZATION WITH Beatrix Fetters;  Surgeon: Laverda Page, MD;  Location: [facility] CATH LAB;  Service: Cardiovascular;  Laterality: N/A;  . Electrophysiologic study N/A 04/25/2015    Procedure: A-Flutter  Ablation;  Surgeon: Thompson Grayer, MD;  Location: [place] CV LAB;  Service: Cardiovascular;   Laterality: N/A;  . Ep implantable device N/A 04/25/2015    Procedure: Loop Recorder Insertion;  Surgeon: Thompson Grayer, MD;  Location: [place] CV LAB;  Service: Cardiovascular;  Laterality: N/A;     Current Outpatient Prescriptions  Medication Sig Dispense Refill  . apixaban (ELIQUIS) 5 MG TABS tablet Take 5 mg by mouth 2 (two) times daily.    Marland Kitchen atorvastatin (LIPITOR) 80 MG tablet Take 40 mg by mouth daily.    . clopidogrel (PLAVIX) 75 MG tablet Take 1 tablet by mouth daily.    Marland Kitchen FLUoxetine (PROZAC) 20 MG tablet Take 20 mg by mouth daily.    . furosemide (LASIX) 40 MG tablet Take 40 mg by mouth daily.     . isosorbide mononitrate (IMDUR) 60 MG 24 hr tablet Take 1 tablet by mouth daily.    Marland Kitchen lisinopril (PRINIVIL,ZESTRIL) 40 MG tablet Take 40 mg by mouth 2 (two) times daily.    . metoprolol (LOPRESSOR) 50 MG tablet Take 50 mg by mouth 2 (two) times daily.    . nitroGLYCERIN (NITROSTAT) 0.4 MG SL tablet Place 0.4 mg under the tongue every 5 (five) minutes as needed for chest pain (up to 3 doses max). For chest pain    . omeprazole (PRILOSEC) 20 MG capsule Take 20 mg by mouth daily.    Marland Kitchen oxyCODONE-acetaminophen (PERCOCET) 5-325 MG per tablet Take 1 tablet by mouth every 6 (six) hours as needed. For pain    . ranolazine (RANEXA) 1000 MG SR tablet Take 500 mg by mouth daily.    Marland Kitchen  amLODipine (NORVASC) 2.5 MG tablet Take 0.625 mg by mouth daily.    . metFORMIN (GLUCOPHAGE) 500 MG tablet Take 500 mg by mouth 2 (two) times daily with a meal.     No current facility-administered medications for this visit.    Allergies:   Codeine phosphate   Social History:  The patient  reports that he quit smoking about 13 years ago. His smoking use included Cigarettes. He quit after 25 years of use. He does not have any smokeless tobacco history on file. He reports that he does not drink alcohol.   Family History:  The patient's family history includes Arthritis in his father and mother; Hypertension in his  mother.    ROS:  Please see the history of present illness.   All other systems are reviewed and negative.    PHYSICAL EXAM: VS:  BP 112/78 mmHg  Pulse 83  Ht 5\' 7"  (1.702 m)  Wt 223 lb (101.152 kg)  BMI 34.92 kg/m2 , BMI Body mass index is 34.92 kg/(m^2). GEN: Obese,  in no acute distress HEENT: normal Neck: no JVD, carotid bruits, or masses Cardiac: Irregular-irregular; no significant murmurs, rubs, or gallops, no edema  Respiratory:  clear to auscultation bilaterally, normal work of breathing GI: soft, nontender, nondistended, + BS MS: no deformity or atrophy Skin: warm and dry  Neuro:  Strength and sensation are intact Psych: euthymic mood, full affect  EKG:  EKG is ordered today. The ekg ordered today shows Aflutter, appears typical    Recent Labs: From Dr. [name]: 02/10/15: BUN 16, Creat 1.76, K+ 4.8, Na+ 139, H/H 13.3/38.7, WBC 6.6, Plts 161    Wt Readings from Last 3 Encounters:  06/07/15 223 lb (101.152 kg)  04/25/15 240 lb (108.863 kg)  03/06/15 255 lb 6.4 oz (115.849 kg)       ASSESSMENT AND PLAN:  1.  Recurrent typical appearing atrial flutter post ablation Unfortunately, he has had recurrent of atrial flutter post ablation.  CHADS2vasc = at least 4 on Eliquis, counseled on importance of not missing any doses  Therapeutic strategies for atrial flutter including medicine and repeat ablation were discussed in detail with the patient today. Risk, benefits, and alternatives to repeat EP study and radiofrequency ablation were also discussed in detail today. These risks include but are not limited to stroke, bleeding, vascular damage, tamponade, perforation, damage to the heart and other structures, AV block requiring pacemaker, worsening renal function, and death. The patient understands these risk and wishes to proceed.  We will therefore proceed with catheter ablation at the next available time.  2. Syncope No further episodes  No arrhythmias on ILR  interrogation  3. CAD myoview reviewed If symptoms do not resolve s/p ablation, may need further evaluation by Dr [name].  4. HTN Stable No change required today   Current medicines are reviewed at length with the patient today.   The patient does not have concerns regarding his medicines.  The following changes were made today:  none  Labs/ tests ordered today include:  Orders Placed This Encounter  Procedures  . Basic metabolic panel  . CBC with Differential  . EKG 12-Lead     Signed,  [place] [address] [phone] (office) [phone] (fax)

## 2015-06-13 NOTE — Telephone Encounter (Signed)
Pt to have 2d ablation 06-15-15.  Pt had first ablation 04/2015.  Pt's wife states had precert on last ablation 04/2015 and asking if pt's ablation on [id number] has been precerted.  Pt has [employer] MCR.  They do not required precert for ablation, CPT 0000000 and precert wasn't required for the 04/2015.  Received "auth#" from [facility] for a courtesy notification for 04/2015 ablation = electronic response for CPT 93656 was "covered/approved" for the CPT code as a billable and covered code under the member's plan.  We did a courtesy notification to [facility] for upcoming ablation also.  Pt's wife understands a "prior authorization " is not required for the ablation.

## 2015-06-14 NOTE — Anesthesia Preprocedure Evaluation (Addendum)
Anesthesia Evaluation  Patient identified by MRN, date of birth, ID band Patient awake    Reviewed: Allergy & Precautions, NPO status , Patient's Chart, lab work & pertinent test results  History of Anesthesia Complications Negative for: history of anesthetic complications  Airway Mallampati: II  TM Distance: >3 FB Neck ROM: Full    Dental  (+) Dental Advisory Given   Pulmonary sleep apnea and Continuous Positive Airway Pressure Ventilation , former smoker,    breath sounds clear to auscultation       Cardiovascular hypertension, Pt. on medications and Pt. on home beta blockers (-) angina (last chest pain a week ago)+ CAD, + Past MI and + CABG (grafts patent)   Rhythm:Regular Rate:Normal  10/16 stress: preserved LVF, small apical ischemia possible   Neuro/Psych negative neurological ROS     GI/Hepatic Neg liver ROS, GERD  Controlled and Medicated,  Endo/Other  diabetes, Oral Hypoglycemic AgentsMorbid obesity  Renal/GU Renal InsufficiencyRenal disease     Musculoskeletal   Abdominal (+) + obese,   Peds  Hematology  (+) Blood dyscrasia (eliquis), ,   Anesthesia Other Findings   Reproductive/Obstetrics                            Lab Results  Component Value Date   WBC 6.1 06/07/2015   HGB 14.3 06/07/2015   HCT 42.0 06/07/2015   MCV 86.6 06/07/2015   PLT 167 06/07/2015   Lab Results  Component Value Date   CREATININE 1.82* 06/07/2015   BUN 26* 06/07/2015   NA 139 06/07/2015   K 4.2 06/07/2015   CL 103 06/07/2015   CO2 24 06/07/2015    Anesthesia Physical  Anesthesia Plan  ASA: III  Anesthesia Plan: MAC   Post-op Pain Management:    Induction: Intravenous  Airway Management Planned: Natural Airway and Simple Face Mask  Additional Equipment:   Intra-op Plan:   Post-operative Plan:   Informed Consent: I have reviewed the patients History and Physical, chart, labs and  discussed the procedure including the risks, benefits and alternatives for the proposed anesthesia with the patient or authorized representative who has indicated his/her understanding and acceptance.   Dental advisory given  Plan Discussed with: Surgeon and CRNA  Anesthesia Plan Comments:         Anesthesia Quick Evaluation

## 2015-06-15 ENCOUNTER — Ambulatory Visit (HOSPITAL_COMMUNITY)
Admission: RE | Admit: 2015-06-15 | Discharge: 2015-06-15 | Disposition: A | Discharge status: Home / Self Care | Payer: Medicare Other | Source: Ambulatory Visit | Attending: Internal Medicine | Admitting: Internal Medicine

## 2015-06-15 ENCOUNTER — Ambulatory Visit (HOSPITAL_COMMUNITY): Payer: Medicare Other | Admitting: Certified Registered"

## 2015-06-15 ENCOUNTER — Encounter (HOSPITAL_COMMUNITY)
Admission: RE | Disposition: A | Discharge status: Home / Self Care | Payer: Medicare Other | Source: Ambulatory Visit | Attending: Internal Medicine

## 2015-06-15 ENCOUNTER — Encounter (HOSPITAL_COMMUNITY): Payer: Self-pay | Admitting: Certified Registered"

## 2015-06-15 DIAGNOSIS — G4733 Obstructive sleep apnea (adult) (pediatric): Secondary | ICD-10-CM | POA: Diagnosis not present

## 2015-06-15 DIAGNOSIS — Z6837 Body mass index (BMI) 37.0-37.9, adult: Secondary | ICD-10-CM | POA: Insufficient documentation

## 2015-06-15 DIAGNOSIS — Z7902 Long term (current) use of antithrombotics/antiplatelets: Secondary | ICD-10-CM | POA: Insufficient documentation

## 2015-06-15 DIAGNOSIS — E785 Hyperlipidemia, unspecified: Secondary | ICD-10-CM | POA: Diagnosis not present

## 2015-06-15 DIAGNOSIS — Z79899 Other long term (current) drug therapy: Secondary | ICD-10-CM | POA: Insufficient documentation

## 2015-06-15 DIAGNOSIS — Z7984 Long term (current) use of oral hypoglycemic drugs: Secondary | ICD-10-CM | POA: Insufficient documentation

## 2015-06-15 DIAGNOSIS — I252 Old myocardial infarction: Secondary | ICD-10-CM | POA: Diagnosis not present

## 2015-06-15 DIAGNOSIS — Z87891 Personal history of nicotine dependence: Secondary | ICD-10-CM | POA: Insufficient documentation

## 2015-06-15 DIAGNOSIS — I4892 Unspecified atrial flutter: Secondary | ICD-10-CM | POA: Diagnosis not present

## 2015-06-15 DIAGNOSIS — E1122 Type 2 diabetes mellitus with diabetic chronic kidney disease: Secondary | ICD-10-CM | POA: Insufficient documentation

## 2015-06-15 DIAGNOSIS — Z7901 Long term (current) use of anticoagulants: Secondary | ICD-10-CM | POA: Insufficient documentation

## 2015-06-15 DIAGNOSIS — I739 Peripheral vascular disease, unspecified: Secondary | ICD-10-CM | POA: Diagnosis not present

## 2015-06-15 DIAGNOSIS — Z951 Presence of aortocoronary bypass graft: Secondary | ICD-10-CM | POA: Diagnosis not present

## 2015-06-15 DIAGNOSIS — I129 Hypertensive chronic kidney disease with stage 1 through stage 4 chronic kidney disease, or unspecified chronic kidney disease: Secondary | ICD-10-CM | POA: Insufficient documentation

## 2015-06-15 DIAGNOSIS — E1151 Type 2 diabetes mellitus with diabetic peripheral angiopathy without gangrene: Secondary | ICD-10-CM | POA: Insufficient documentation

## 2015-06-15 DIAGNOSIS — N184 Chronic kidney disease, stage 4 (severe): Secondary | ICD-10-CM | POA: Diagnosis not present

## 2015-06-15 DIAGNOSIS — I471 Supraventricular tachycardia: Secondary | ICD-10-CM | POA: Diagnosis present

## 2015-06-15 DIAGNOSIS — K219 Gastro-esophageal reflux disease without esophagitis: Secondary | ICD-10-CM | POA: Insufficient documentation

## 2015-06-15 DIAGNOSIS — I483 Typical atrial flutter: Secondary | ICD-10-CM | POA: Diagnosis present

## 2015-06-15 DIAGNOSIS — I2510 Atherosclerotic heart disease of native coronary artery without angina pectoris: Secondary | ICD-10-CM | POA: Diagnosis not present

## 2015-06-15 DIAGNOSIS — R55 Syncope and collapse: Secondary | ICD-10-CM | POA: Diagnosis present

## 2015-06-15 HISTORY — PX: ELECTROPHYSIOLOGIC STUDY: SHX172A

## 2015-06-15 LAB — GLUCOSE, CAPILLARY
GLUCOSE-CAPILLARY: 103 mg/dL — AB (ref 65–99)
Glucose-Capillary: 97 mg/dL (ref 65–99)

## 2015-06-15 SURGERY — A-FLUTTER/A-TACH/SVT ABLATION
Anesthesia: Monitor Anesthesia Care

## 2015-06-15 MED ORDER — BUPIVACAINE HCL (PF) 0.25 % IJ SOLN
INTRAMUSCULAR | Status: AC
  Filled 2015-06-15: qty 30

## 2015-06-15 MED ORDER — METFORMIN HCL 500 MG PO TABS
500.0000 mg | ORAL_TABLET | Freq: Two times a day (BID) | ORAL | Status: DC
  Filled 2015-06-15: qty 1

## 2015-06-15 MED ORDER — SODIUM CHLORIDE 0.9 % IV SOLN: 250.0000 mL | INTRAVENOUS | Status: DC | PRN

## 2015-06-15 MED ORDER — SODIUM CHLORIDE 0.9% FLUSH: 3.0000 mL | Freq: Two times a day (BID) | INTRAVENOUS | Status: DC

## 2015-06-15 MED ORDER — OXYCODONE-ACETAMINOPHEN 5-325 MG PO TABS: 1.0000 | ORAL_TABLET | Freq: Four times a day (QID) | ORAL | Status: DC | PRN

## 2015-06-15 MED ORDER — FLUOXETINE HCL 20 MG PO TABS
20.0000 mg | ORAL_TABLET | Freq: Every day | ORAL | Status: DC
  Filled 2015-06-15: qty 1

## 2015-06-15 MED ORDER — DOBUTAMINE IN D5W 4-5 MG/ML-% IV SOLN
INTRAVENOUS | Status: AC
  Filled 2015-06-15: qty 250

## 2015-06-15 MED ORDER — LISINOPRIL 40 MG PO TABS
40.0000 mg | ORAL_TABLET | Freq: Two times a day (BID) | ORAL | Status: DC
  Administered 2015-06-15: 40 mg via ORAL
  Filled 2015-06-15: qty 1

## 2015-06-15 MED ORDER — SODIUM CHLORIDE 0.9 % IV SOLN
INTRAVENOUS | Status: DC
  Administered 2015-06-15: 06:00:00 via INTRAVENOUS

## 2015-06-15 MED ORDER — CLOPIDOGREL BISULFATE 75 MG PO TABS: 75.0000 mg | ORAL_TABLET | Freq: Every day | ORAL | Status: DC

## 2015-06-15 MED ORDER — ACETAMINOPHEN 325 MG PO TABS: 650.0000 mg | ORAL_TABLET | ORAL | Status: DC | PRN

## 2015-06-15 MED ORDER — APIXABAN 5 MG PO TABS
5.0000 mg | ORAL_TABLET | Freq: Two times a day (BID) | ORAL | Status: DC
  Administered 2015-06-15: 5 mg via ORAL
  Filled 2015-06-15: qty 1

## 2015-06-15 MED ORDER — SODIUM CHLORIDE 0.9% FLUSH: 3.0000 mL | INTRAVENOUS | Status: DC | PRN

## 2015-06-15 MED ORDER — BUPIVACAINE HCL (PF) 0.25 % IJ SOLN
INTRAMUSCULAR | Status: DC | PRN
  Administered 2015-06-15: 30 mL

## 2015-06-15 MED ORDER — FLUOXETINE HCL 20 MG PO CAPS
20.0000 mg | ORAL_CAPSULE | Freq: Every day | ORAL | Status: DC
  Filled 2015-06-15: qty 1

## 2015-06-15 MED ORDER — RANOLAZINE ER 500 MG PO TB12
500.0000 mg | ORAL_TABLET | Freq: Every day | ORAL | Status: DC
  Filled 2015-06-15: qty 1

## 2015-06-15 MED ORDER — ONDANSETRON HCL 4 MG/2ML IJ SOLN: 4.0000 mg | Freq: Four times a day (QID) | INTRAMUSCULAR | Status: DC | PRN

## 2015-06-15 MED ORDER — RANOLAZINE ER 500 MG PO TB12: 500.0000 mg | ORAL_TABLET | Freq: Every day | ORAL | Status: DC

## 2015-06-15 SURGICAL SUPPLY — 12 items
BAG SNAP BAND KOVER 36X36 (MISCELLANEOUS) ×2 IMPLANT
BLANKET WARM UNDERBOD FULL ACC (MISCELLANEOUS) ×2 IMPLANT
CATH CELSIUS FLTR 8MM (ABLATOR) ×2 IMPLANT
CATH DUODECA HALO/ISMUS 7FR (CATHETERS) ×2 IMPLANT
CATH JOSEPHSON QUAD-ALLRED 6FR (CATHETERS) ×2 IMPLANT
CATH WEB BIDIR CS D-F NONAUTO (CATHETERS) ×2 IMPLANT
PACK EP LATEX FREE (CUSTOM PROCEDURE TRAY) ×1
PACK EP LF (CUSTOM PROCEDURE TRAY) ×1 IMPLANT
PAD DEFIB LIFELINK (PAD) ×2 IMPLANT
SHEATH PINNACLE 7F 10CM (SHEATH) ×4 IMPLANT
SHEATH PINNACLE 8F 10CM (SHEATH) ×2 IMPLANT
SHIELD RADPAD SCOOP 12X17 (MISCELLANEOUS) ×2 IMPLANT

## 2015-06-15 NOTE — Interval H&P Note (Signed)
History and Physical Interval Note:  06/15/2015 7:23 AM  [name]  has presented today for surgery, with the diagnosis of atrial flutter  The various methods of treatment have been discussed with the patient and family. After consideration of risks, benefits and other options for treatment, the patient has consented to  Procedure(s): A-Flutter Ablation (N/A) as a surgical intervention .  The patient's history has been reviewed, patient examined, no change in status, stable for surgery.  I have reviewed the patient's chart and labs.  Questions were answered to the patient's satisfaction.     Thompson Grayer

## 2015-06-15 NOTE — Progress Notes (Addendum)
Site area: Right groin a 8 french and 2-7 french venous sheath was removed  Site Prior to Removal:  Level 0  Pressure Applied For 15 MINUTES    Minutes Beginning at 0940a  Manual:   Yes.    Patient Status During Pull:  stable  Post Pull Groin Site:  Level 0  Post Pull Instructions Given:  Yes.    Post Pull Pulses Present:  Yes.    Dressing Applied:  Yes.    Comments:  BP elevated and Lisinopril 40mg  PO given per MD order.

## 2015-06-15 NOTE — Transfer of Care (Signed)
Immediate Anesthesia Transfer of Care Note  Patient: [name]  Procedure(s) Performed: Procedure(s): A-Flutter Ablation (N/A)  Patient Location: Cath Lab  Anesthesia Type:MAC  Level of Consciousness: awake and patient cooperative  Airway & Oxygen Therapy: Patient Spontanous Breathing and Patient connected to nasal cannula oxygen  Post-op Assessment: Report given to RN, Post -op Vital signs reviewed and stable and Patient moving all extremities  Post vital signs: Reviewed and stable  Last Vitals:  Filed Vitals:   06/15/15 0556  BP: 126/81  Pulse: 86  Temp: 36.7 C  Resp: 17    Complications: No apparent anesthesia complications

## 2015-06-15 NOTE — Anesthesia Postprocedure Evaluation (Signed)
Anesthesia Post Note  Patient: [name]  Procedure(s) Performed: Procedure(s) (LRB): A-Flutter Ablation (N/A)  Patient location during evaluation: PACU Anesthesia Type: MAC Level of consciousness: awake and alert Pain management: pain level controlled Vital Signs Assessment: post-procedure vital signs reviewed and stable Respiratory status: spontaneous breathing Cardiovascular status: blood pressure returned to baseline Anesthetic complications: no    Last Vitals:  Filed Vitals:   06/15/15 1440 06/15/15 1516  BP: 109/58 105/91  Pulse: 80 86  Temp:  36.4 C  Resp: 13 16    Last Pain: There were no vitals filed for this visit.               Tiajuana Amass

## 2015-06-15 NOTE — Interval H&P Note (Signed)
History and Physical Interval Note:  06/15/2015 7:24 AM  [name]  has presented today for surgery, with the diagnosis of atrial flutter  The various methods of treatment have been discussed with the patient and family. After consideration of risks, benefits and other options for treatment, the patient has consented to  Procedure(s): A-Flutter Ablation (N/A) as a surgical intervention .  The patient's history has been reviewed, patient examined, no change in status, stable for surgery.  I have reviewed the patient's chart and labs.  Questions were answered to the patient's satisfaction.     Pt with nocturnal pause captured on LINQ  (per Dr [name]).  Metoprolol stopped with no further pauses.  Will evaluate AV conduction with EPS during ablation.    Thompson Grayer

## 2015-06-15 NOTE — Progress Notes (Signed)
Doing well post flutter ablation Plan discharge home today Continue Eliquis for 4 weeks Follow up with Dr Rayann Heman 4 weeks Wound care reviewed with patient.  Chanetta Marshall, NP 06/15/2015 5:28 PM  Doing well s/p atrial flutter ablation Plan as above  Thompson Grayer MD, [facility] 06/15/2015 5:50 PM

## 2015-06-15 NOTE — H&P (View-Only) (Signed)
Electrophysiology Office Note   Date:  06/12/2015   ID:  [name], DOB January 23, 1941, MRN [id number]  PCP:  Tamsen Roers, MD  Cardiologist:  Dr. [name] Primary Electrophysiologist: Dr. Rayann Heman    Chief Complaint  Patient presents with  .   Marland Kitchen Atrial Flutter     History of Present Illness: [name] is a 75 y.o. male who presents today for electrophysiology follow-up of Atrial Flutter. He recently underwent atrial flutter ablation by me 04/25/15.  He did well with the procedure but unfortunately now returns with typical appearing atrial flutter.  He continues to have reduced exercise tolerance.  He has had no further syncope.  Denies procedure related complications.  Today, he denies symptoms of palpitations, chest pain, shortness of breath, orthopnea, PND, lower extremity edema, claudication, dizziness, presyncope, syncope, bleeding, or neurologic sequela. The patient is tolerating medications without difficulties and is otherwise without complaint today.    Past Medical History  Diagnosis Date  . Myocardial infarction ([place])   . Hypertension   . Obesity   . Coronary artery disease     CABG x4 in 2004  . Peripheral vascular disease ([place])     Renal artery angioplast 2004  . Obstructive sleep apnea   . Hyperlipidemia   . Chronic kidney disease     Stage IV, nephrologist Dr. Sharyne Richters  . Diabetes [facility])    Past Surgical History  Procedure Laterality Date  . Coronary artery bypass graft  06/08/2002  . Anterior fusion lumbar spine    . Laminectomy    . Left and right heart catheterization with coronary/graft angiogram N/A 10/08/2011    Procedure: LEFT AND RIGHT HEART CATHETERIZATION WITH Beatrix Fetters;  Surgeon: Laverda Page, MD;  Location: [facility] CATH LAB;  Service: Cardiovascular;  Laterality: N/A;  . Electrophysiologic study N/A 04/25/2015    Procedure: A-Flutter  Ablation;  Surgeon: Thompson Grayer, MD;  Location: [place] CV LAB;  Service: Cardiovascular;   Laterality: N/A;  . Ep implantable device N/A 04/25/2015    Procedure: Loop Recorder Insertion;  Surgeon: Thompson Grayer, MD;  Location: [place] CV LAB;  Service: Cardiovascular;  Laterality: N/A;     Current Outpatient Prescriptions  Medication Sig Dispense Refill  . apixaban (ELIQUIS) 5 MG TABS tablet Take 5 mg by mouth 2 (two) times daily.    Marland Kitchen atorvastatin (LIPITOR) 80 MG tablet Take 40 mg by mouth daily.    . clopidogrel (PLAVIX) 75 MG tablet Take 1 tablet by mouth daily.    Marland Kitchen FLUoxetine (PROZAC) 20 MG tablet Take 20 mg by mouth daily.    . furosemide (LASIX) 40 MG tablet Take 40 mg by mouth daily.     . isosorbide mononitrate (IMDUR) 60 MG 24 hr tablet Take 1 tablet by mouth daily.    Marland Kitchen lisinopril (PRINIVIL,ZESTRIL) 40 MG tablet Take 40 mg by mouth 2 (two) times daily.    . metoprolol (LOPRESSOR) 50 MG tablet Take 50 mg by mouth 2 (two) times daily.    . nitroGLYCERIN (NITROSTAT) 0.4 MG SL tablet Place 0.4 mg under the tongue every 5 (five) minutes as needed for chest pain (up to 3 doses max). For chest pain    . omeprazole (PRILOSEC) 20 MG capsule Take 20 mg by mouth daily.    Marland Kitchen oxyCODONE-acetaminophen (PERCOCET) 5-325 MG per tablet Take 1 tablet by mouth every 6 (six) hours as needed. For pain    . ranolazine (RANEXA) 1000 MG SR tablet Take 500 mg by mouth daily.    Marland Kitchen  amLODipine (NORVASC) 2.5 MG tablet Take 0.625 mg by mouth daily.    . metFORMIN (GLUCOPHAGE) 500 MG tablet Take 500 mg by mouth 2 (two) times daily with a meal.     No current facility-administered medications for this visit.    Allergies:   Codeine phosphate   Social History:  The patient  reports that he quit smoking about 13 years ago. His smoking use included Cigarettes. He quit after 25 years of use. He does not have any smokeless tobacco history on file. He reports that he does not drink alcohol.   Family History:  The patient's family history includes Arthritis in his father and mother; Hypertension in his  mother.    ROS:  Please see the history of present illness.   All other systems are reviewed and negative.    PHYSICAL EXAM: VS:  BP 112/78 mmHg  Pulse 83  Ht 5\' 7"  (1.702 m)  Wt 223 lb (101.152 kg)  BMI 34.92 kg/m2 , BMI Body mass index is 34.92 kg/(m^2). GEN: Obese,  in no acute distress HEENT: normal Neck: no JVD, carotid bruits, or masses Cardiac: Irregular-irregular; no significant murmurs, rubs, or gallops, no edema  Respiratory:  clear to auscultation bilaterally, normal work of breathing GI: soft, nontender, nondistended, + BS MS: no deformity or atrophy Skin: warm and dry  Neuro:  Strength and sensation are intact Psych: euthymic mood, full affect  EKG:  EKG is ordered today. The ekg ordered today shows Aflutter, appears typical    Recent Labs: From Dr. [name]: 02/10/15: BUN 16, Creat 1.76, K+ 4.8, Na+ 139, H/H 13.3/38.7, WBC 6.6, Plts 161    Wt Readings from Last 3 Encounters:  06/07/15 223 lb (101.152 kg)  04/25/15 240 lb (108.863 kg)  03/06/15 255 lb 6.4 oz (115.849 kg)       ASSESSMENT AND PLAN:  1.  Recurrent typical appearing atrial flutter post ablation Unfortunately, he has had recurrent of atrial flutter post ablation.  CHADS2vasc = at least 4 on Eliquis, counseled on importance of not missing any doses  Therapeutic strategies for atrial flutter including medicine and repeat ablation were discussed in detail with the patient today. Risk, benefits, and alternatives to repeat EP study and radiofrequency ablation were also discussed in detail today. These risks include but are not limited to stroke, bleeding, vascular damage, tamponade, perforation, damage to the heart and other structures, AV block requiring pacemaker, worsening renal function, and death. The patient understands these risk and wishes to proceed.  We will therefore proceed with catheter ablation at the next available time.  2. Syncope No further episodes  No arrhythmias on ILR  interrogation  3. CAD myoview reviewed If symptoms do not resolve s/p ablation, may need further evaluation by Dr [name].  4. HTN Stable No change required today   Current medicines are reviewed at length with the patient today.   The patient does not have concerns regarding his medicines.  The following changes were made today:  none  Labs/ tests ordered today include:  Orders Placed This Encounter  Procedures  . Basic metabolic panel  . CBC with Differential  . EKG 12-Lead     Signed,  [place] [address] [phone] (office) [phone] (fax)

## 2015-06-16 MED FILL — Dobutamine in Dextrose 5% Inj 4 MG/ML: INTRAVENOUS | Qty: 250 | Status: AC

## 2015-07-07 LAB — CUP PACEART REMOTE DEVICE CHECK: MDC IDC SESS DTM: 20170105153808

## 2015-07-07 NOTE — Progress Notes (Signed)
Carelink summary report received. Battery status OK. Normal device function. No new symptom episodes, tachy episodes, brady, or pause episodes. No new AF episodes. Monthly summary reports and ROV/PRN 

## 2015-07-10 ENCOUNTER — Ambulatory Visit (INDEPENDENT_AMBULATORY_CARE_PROVIDER_SITE_OTHER): Payer: Medicare Other | Admitting: Internal Medicine

## 2015-07-10 ENCOUNTER — Other Ambulatory Visit: Payer: Medicare Other

## 2015-07-10 ENCOUNTER — Encounter: Payer: Medicare Other | Admitting: Internal Medicine

## 2015-07-10 ENCOUNTER — Encounter: Payer: Self-pay | Admitting: Internal Medicine

## 2015-07-10 VITALS — BP 140/82 | HR 59 | Ht 67.0 in | Wt 224.6 lb

## 2015-07-10 DIAGNOSIS — R55 Syncope and collapse: Secondary | ICD-10-CM

## 2015-07-10 DIAGNOSIS — I483 Typical atrial flutter: Secondary | ICD-10-CM | POA: Diagnosis not present

## 2015-07-10 LAB — CUP PACEART INCLINIC DEVICE CHECK: Date Time Interrogation Session: 20170220100042

## 2015-07-10 NOTE — Patient Instructions (Signed)
Medication Instructions:  Your physician has recommended you make the following change in your medication:  1) Stop Eliquis 2) Stop Metoprolol    Labwork: None ordered   Testing/Procedures: None ordered   Follow-Up: Your physician recommends that you schedule a follow-up appointment as needed   Any Other Special Instructions Will Be Listed Below (If Applicable).     If you need a refill on your cardiac medications before your next appointment, please call your pharmacy.

## 2015-07-10 NOTE — Progress Notes (Signed)
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 male who presents today for routine electrophysiology followup.  Since his recent flutter ablation, the patient reports doing very well.  No symptoms of arrhythmia.  No procedure related issues.  Today, he denies symptoms of palpitations, chest pain, shortness of breath,  lower extremity edema, dizziness, presyncope, or syncope.  The patient is otherwise without complaint today.   Past Medical History  Diagnosis Date  . Myocardial infarction ([place])   . Hypertension   . Obesity   . Coronary artery disease     CABG x4 in 2004  . Peripheral vascular disease ([place])     Renal artery angioplast 2004  . Obstructive sleep apnea   . Hyperlipidemia   . Chronic kidney disease     Stage IV, nephrologist Dr. Sharyne Richters  . Diabetes [facility])    Past Surgical History  Procedure Laterality Date  . Coronary artery bypass graft  06/08/2002  . Anterior fusion lumbar spine    . Laminectomy    . Left and right heart catheterization with coronary/graft angiogram N/A 10/08/2011    Procedure: LEFT AND RIGHT HEART CATHETERIZATION WITH Beatrix Fetters;  Surgeon: Laverda Page, MD;  Location: [facility] CATH LAB;  Service: Cardiovascular;  Laterality: N/A;  . Electrophysiologic study N/A 04/25/2015    Procedure: A-Flutter  Ablation;  Surgeon: Thompson Grayer, MD;  Location: [place] CV LAB;  Service: Cardiovascular;  Laterality: N/A;  . Ep implantable device N/A 04/25/2015    Procedure: Loop Recorder Insertion;  Surgeon: Thompson Grayer, MD;  Location: [place] CV LAB;  Service: Cardiovascular;  Laterality: N/A;  . Electrophysiologic study N/A 06/15/2015    repeat atrial flutter ablation    ROS- all systems are reviewed and negatives except as per HPI above  Current Outpatient Prescriptions  Medication Sig Dispense Refill  . amLODipine (NORVASC) 2.5 MG tablet Take 0.625 mg by mouth daily.    Marland Kitchen atorvastatin (LIPITOR) 80 MG tablet  Take 40 mg by mouth daily.    . clopidogrel (PLAVIX) 75 MG tablet Take 1 tablet by mouth daily.    Marland Kitchen FLUoxetine (PROZAC) 20 MG tablet Take 20 mg by mouth daily.    . isosorbide mononitrate (IMDUR) 60 MG 24 hr tablet Take 1 tablet by mouth daily.    . metFORMIN (GLUCOPHAGE) 1000 MG tablet Take 500 mg by mouth 2 (two) times daily with a meal.    . nitroGLYCERIN (NITROSTAT) 0.4 MG SL tablet Place 0.4 mg under the tongue every 5 (five) minutes as needed for chest pain (up to 3 doses max). For chest pain    . omeprazole (PRILOSEC) 20 MG capsule Take 20 mg by mouth daily.    Marland Kitchen oxyCODONE-acetaminophen (PERCOCET) 5-325 MG per tablet Take 1 tablet by mouth every 6 (six) hours as needed. For pain    . ranolazine (RANEXA) 1000 MG SR tablet Take 500 mg by mouth daily.     No current facility-administered medications for this visit.    Physical Exam: Filed Vitals:   07/10/15 0947  BP: 140/82  Pulse: 59  Height: 5\' 7"  (1.702 m)  Weight: 224 lb 9.6 oz (101.878 kg)    GEN- The patient is well appearing, alert and oriented x 3 today.   Head- normocephalic, atraumatic Eyes-  Sclera clear, conjunctiva pink Ears- hearing intact Oropharynx- clear Lungs- Clear to ausculation bilaterally, normal work of breathing Heart- Regular rate and rhythm, no murmurs, rubs or gallops, PMI not  laterally displaced GI- soft, NT, ND, + BS Extremities- no clubbing, cyanosis, or edema  ekg today reveals sinus rhythm 59 bpm, PR 1254 msec, rsr' (QRS 124 msec)  Assessment and Plan:  1. Atrial flutter Resolved s/p ablation Stop eliquis Stop metoprolol  2. Prior prolonged RR intervals during atrial flutter, prior syncope EPS did not reveal significant AV nodal conduction issues, but would avoid av nodal agents going forward ILR to be followed by Dr [name] it appears  I will see as needed going forward  Thompson Grayer MD, [facility] 07/10/2015 10:22 AM

## 2015-08-14 ENCOUNTER — Ambulatory Visit: Payer: Medicare Other | Admitting: Internal Medicine

## 2017-08-02 ENCOUNTER — Emergency Department
Admission: EM | Admit: 2017-08-02 | Discharge: 2017-08-02 | Discharge status: Absent without leave | Payer: Medicare Other | Source: Home / Self Care

## 2017-08-06 DIAGNOSIS — J90 Pleural effusion, not elsewhere classified: Secondary | ICD-10-CM | POA: Insufficient documentation

## 2017-08-06 DIAGNOSIS — I480 Paroxysmal atrial fibrillation: Secondary | ICD-10-CM | POA: Insufficient documentation

## 2017-08-06 DIAGNOSIS — E870 Hyperosmolality and hypernatremia: Secondary | ICD-10-CM | POA: Insufficient documentation

## 2017-11-09 DIAGNOSIS — N400 Enlarged prostate without lower urinary tract symptoms: Secondary | ICD-10-CM | POA: Insufficient documentation

## 2017-12-11 ENCOUNTER — Institutional Professional Consult (permissible substitution): Payer: Medicare Other | Admitting: Pulmonary Disease

## 2018-07-12 DIAGNOSIS — Z4509 Encounter for adjustment and management of other cardiac device: Secondary | ICD-10-CM

## 2018-07-12 DIAGNOSIS — R55 Syncope and collapse: Secondary | ICD-10-CM | POA: Diagnosis not present

## 2018-07-12 DIAGNOSIS — Z95818 Presence of other cardiac implants and grafts: Secondary | ICD-10-CM

## 2018-07-22 ENCOUNTER — Other Ambulatory Visit: Payer: Self-pay

## 2018-07-22 DIAGNOSIS — I10 Essential (primary) hypertension: Secondary | ICD-10-CM

## 2018-07-22 MED ORDER — METOPROLOL TARTRATE 50 MG PO TABS: 50.0000 mg | ORAL_TABLET | Freq: Every day | ORAL | 3 refills | Status: DC

## 2018-07-28 ENCOUNTER — Other Ambulatory Visit: Payer: Self-pay

## 2018-07-28 MED ORDER — ISOSORBIDE MONONITRATE ER 60 MG PO TB24: 60.0000 mg | ORAL_TABLET | Freq: Every day | ORAL | 2 refills | Status: DC

## 2018-08-11 ENCOUNTER — Other Ambulatory Visit: Payer: Self-pay | Admitting: Cardiology

## 2018-08-12 DIAGNOSIS — Z95818 Presence of other cardiac implants and grafts: Secondary | ICD-10-CM

## 2018-08-12 DIAGNOSIS — Z4509 Encounter for adjustment and management of other cardiac device: Secondary | ICD-10-CM | POA: Diagnosis not present

## 2018-08-12 DIAGNOSIS — R55 Syncope and collapse: Secondary | ICD-10-CM | POA: Diagnosis not present

## 2018-08-17 ENCOUNTER — Other Ambulatory Visit: Payer: Self-pay | Admitting: Cardiology

## 2018-09-12 DIAGNOSIS — Z95818 Presence of other cardiac implants and grafts: Secondary | ICD-10-CM

## 2018-09-12 DIAGNOSIS — R55 Syncope and collapse: Secondary | ICD-10-CM

## 2018-09-12 DIAGNOSIS — Z4509 Encounter for adjustment and management of other cardiac device: Secondary | ICD-10-CM

## 2018-09-13 ENCOUNTER — Other Ambulatory Visit: Payer: Self-pay | Admitting: Cardiology

## 2018-09-21 HISTORY — PX: COLONOSCOPY: SHX174

## 2018-09-25 ENCOUNTER — Encounter: Payer: Self-pay | Admitting: Cardiology

## 2018-09-25 ENCOUNTER — Other Ambulatory Visit: Payer: Self-pay

## 2018-09-25 ENCOUNTER — Ambulatory Visit (INDEPENDENT_AMBULATORY_CARE_PROVIDER_SITE_OTHER): Payer: Medicare Other | Admitting: Cardiology

## 2018-09-25 VITALS — BP 144/64 | HR 55 | Ht 67.0 in | Wt 212.0 lb

## 2018-09-25 DIAGNOSIS — Z9889 Other specified postprocedural states: Secondary | ICD-10-CM

## 2018-09-25 DIAGNOSIS — I25709 Atherosclerosis of coronary artery bypass graft(s), unspecified, with unspecified angina pectoris: Secondary | ICD-10-CM

## 2018-09-25 DIAGNOSIS — N184 Chronic kidney disease, stage 4 (severe): Secondary | ICD-10-CM

## 2018-09-25 DIAGNOSIS — I25118 Atherosclerotic heart disease of native coronary artery with other forms of angina pectoris: Secondary | ICD-10-CM | POA: Diagnosis not present

## 2018-09-25 DIAGNOSIS — I5032 Chronic diastolic (congestive) heart failure: Secondary | ICD-10-CM

## 2018-09-25 DIAGNOSIS — I480 Paroxysmal atrial fibrillation: Secondary | ICD-10-CM | POA: Diagnosis not present

## 2018-09-25 DIAGNOSIS — I129 Hypertensive chronic kidney disease with stage 1 through stage 4 chronic kidney disease, or unspecified chronic kidney disease: Secondary | ICD-10-CM

## 2018-09-25 DIAGNOSIS — E1122 Type 2 diabetes mellitus with diabetic chronic kidney disease: Secondary | ICD-10-CM

## 2018-09-25 NOTE — Progress Notes (Signed)
Virtual Visit via Video Note: This visit type was conducted due to national recommendations for restrictions regarding the COVID-19 Pandemic (e.g. social distancing).  This format is felt to be most appropriate for this patient at this time.  All issues noted in this document were discussed and addressed.  No physical exam was performed (except for noted visual exam findings with Telehealth visits).  The patient has consented to conduct a Telehealth visit and understands insurance will be billed.   I connected with@, on 09/26/18 at  by a video enabled telemedicine application and verified that I am speaking with the correct person using two identifiers.   I discussed the limitations of evaluation and management by telemedicine and the availability of in person appointments. The patient expressed understanding and agreed to proceed.   I have discussed with patient regarding the safety during COVID Pandemic and steps and precautions to be taken including social distancing, frequent hand wash and use of detergent soap, gels with the patient. I asked the patient to avoid touching mouth, nose, eyes, ears with the hands. I encouraged regular walking around the neighborhood and exercise and regular diet, as long as social distancing can be maintained.  Primary Physician/Referring:  Amador Cunas, FNP  Patient ID: [name], male    DOB: Apr 18, 1941, 78 y.o.   MRN: [id number]  Chief Complaint  Patient presents with   Atrial Fibrillation    6 month f/u     HPI: [name]  is a 78 y.o. male  with CAD and CABG in 2004, Coronary angiogram 10/08/2011 revealed occluded saphenous graft to the circumflex coronary artery. Now on Ranexa for angina. 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 He is now scheduled for repeat sleep study due to get new machine. 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  He is now being followed by oncology and has not had any recurrence of GI bleed.  He had atrial flutter ablation in 20162 but developed atrial fibrillation on loop recorder monitor which was implanted for evaluation of syncope.  Presently on long-term and correlation with Eliquis.  Patient is here on a six-month virtual office visit and follow-up of chronic diastolic heart failure. He has not had any acute decompensation. He is feeling well except for worsening sciatica and has gained about 11 Lbs. States back pain has limited his activity.  NO PND or orthopnea, no leg edema, no palpitations. He has done a good job having lost weight and is still careful with his diet.   Past Medical History:  Diagnosis Date   Chronic kidney disease    Stage IV, nephrologist Dr. Sharyne Richters   Coronary artery disease    CABG x4 in 2004   Diabetes [facility])    Hyperlipidemia    Hypertension    Myocardial infarction [facility])    Obesity    Obstructive sleep apnea    Peripheral vascular disease ([place])    Renal artery angioplast 2004    Past Surgical History:  Procedure Laterality Date   ANTERIOR FUSION LUMBAR SPINE     COLONOSCOPY  09/21/2018   CORONARY ARTERY BYPASS GRAFT  06/08/2002   ELECTROPHYSIOLOGIC STUDY N/A 04/25/2015  Procedure: A-Flutter  Ablation;  Surgeon: Thompson Grayer, MD;  Location: [place] CV LAB;  Service: Cardiovascular;  Laterality: N/A;   ELECTROPHYSIOLOGIC STUDY N/A 06/15/2015   repeat atrial flutter ablation   EP IMPLANTABLE DEVICE N/A 04/25/2015   Procedure: Loop Recorder Insertion;  Surgeon: Thompson Grayer, MD;  Location: [place] CV LAB;  Service: Cardiovascular;  Laterality: N/A;   LAMINECTOMY     LEFT AND RIGHT HEART CATHETERIZATION WITH  CORONARY/GRAFT ANGIOGRAM N/A 10/08/2011   Procedure: LEFT AND RIGHT HEART CATHETERIZATION WITH Beatrix Fetters;  Surgeon: Laverda Page, MD;  Location[facility] CATH LAB;  Service: Cardiovascular;  Laterality: N/A;    Social History   Socioeconomic History   Marital status: Married    Spouse name: Not on file   Number of children: Not on file   Years of education: Not on file   Highest education level: Not on file  Occupational History   Not on file  Social Needs   Financial resource strain: Not on file   Food insecurity:    Worry: Not on file    Inability: Not on file   Transportation needs:    Medical: Not on file    Non-medical: Not on file  Tobacco Use   Smoking status: Former Smoker    Years: 25.00    Types: Cigarettes    Last attempt to quit: 12/18/2001    Years since quitting: 16.7   Smokeless tobacco: Never Used  Substance and Sexual Activity   Alcohol use: No   Drug use: Not on file   Sexual activity: Not on file  Lifestyle   Physical activity:    Days per week: Not on file    Minutes per session: Not on file   Stress: Not on file  Relationships   Social connections:    Talks on phone: Not on file    Gets together: Not on file    Attends religious service: Not on file    Active member of club or organization: Not on file    Attends meetings of clubs or organizations: Not on file    Relationship status: Not on file   Intimate partner violence:    Fear of current or ex partner: Not on file    Emotionally abused: Not on file    Physically abused: Not on file    Forced sexual activity: Not on file  Other Topics Concern   Not on file  Social History Narrative   Not on file    Current Outpatient Medications on File Prior to Visit  Medication Sig Dispense Refill   apixaban (ELIQUIS) 5 MG TABS tablet Take 0.5 tablets by mouth 2 (two) times a day.     ketorolac (ACULAR) 0.5 % ophthalmic solution Place 1 drop into both eyes 2 (two)  times a day.     tamsulosin (FLOMAX) 0.4 MG CAPS capsule Take 1 capsule by mouth at bedtime.     Vitamin D, Ergocalciferol, (DRISDOL) 1.25 MG (50000 UT) CAPS capsule Take 1 capsule by mouth once a week.     amLODipine (NORVASC) 2.5 MG tablet Take 0.625 mg by mouth daily.     atorvastatin (LIPITOR) 80 MG tablet Take 40 mg by mouth daily.     FLUoxetine (PROZAC) 20 MG tablet Take 20 mg by mouth daily.     furosemide (LASIX) 40 MG tablet TAKE 1 TABLET BY MOUTH  EVERY MORNING 90 tablet 1   isosorbide mononitrate (IMDUR) 60 MG 24 hr tablet TAKE 1  TABLET BY MOUTH  DAILY 90 tablet 2   metFORMIN (GLUCOPHAGE) 1000 MG tablet Take 500 mg by mouth 2 (two) times daily with a meal.     metoprolol succinate (TOPROL-XL) 50 MG 24 hr tablet TAKE 1 TABLET BY MOUTH  DAILY 90 tablet 0   metoprolol tartrate (LOPRESSOR) 50 MG tablet Take 1 tablet (50 mg total) by mouth daily. 90 tablet 3   nitroGLYCERIN (NITROSTAT) 0.4 MG SL tablet Place 0.4 mg under the tongue every 5 (five) minutes as needed for chest pain (up to 3 doses max). For chest pain     omeprazole (PRILOSEC) 20 MG capsule Take 20 mg by mouth daily.     oxyCODONE-acetaminophen (PERCOCET) 5-325 MG per tablet Take 1 tablet by mouth every 6 (six) hours as needed. For pain     ranolazine (RANEXA) 1000 MG SR tablet Take 500 mg by mouth daily.     No current facility-administered medications on file prior to visit.     Review of Systems  Constitution: Negative for chills, decreased appetite, malaise/fatigue and weight gain.  Cardiovascular: Positive for claudication (Neurogenic claudication left leg) and dyspnea on exertion (chronic and stable). Negative for chest pain, leg swelling, palpitations and syncope.  Respiratory: Positive for sleep disturbances due to breathing (has OSA and on CPAP). Negative for wheezing.   Endocrine: Negative for cold intolerance.  Hematologic/Lymphatic: Does not bruise/bleed easily.  Musculoskeletal: Positive for  back pain (chronic) and muscle weakness (left leg sciatica). Negative for joint swelling.  Gastrointestinal: Negative for abdominal pain, anorexia and change in bowel habit.  Neurological: Negative for headaches and light-headedness.  Psychiatric/Behavioral: Negative for depression and substance abuse.  All other systems reviewed and are negative.     Objective  Blood pressure (!) 144/64, pulse (!) 55, height '5\' 7"'  (1.702 m), weight 212 lb (96.2 kg). Body mass index is 33.2 kg/m. Physical exam not performed or limited due to virtual visit.  Patient appeared to be in no distress, Neck was supple, respiration was not labored. No leg edema.  Please see exam details from prior visit is as below.    Physical Exam  Constitutional: He appears well-developed and well-nourished. No distress.  HENT:  Head: Atraumatic.  Eyes: Conjunctivae are normal.  Neck: Neck supple. No JVD present. No thyromegaly present.  Cardiovascular: Normal rate, regular rhythm, normal heart sounds and intact distal pulses. Exam reveals no gallop.  No murmur heard. Trace edema bilateral  Pulmonary/Chest: Effort normal and breath sounds normal.  Abdominal: Soft. Bowel sounds are normal.  Musculoskeletal: Normal range of motion.  Neurological: He is alert.  Skin: Skin is warm and dry.  Psychiatric: He has a normal mood and affect.   Radiology: No results found.  Laboratory examination:   Labs 05/31/2018: HB 2.7/60 9.4, platelets 157.  BUN 44, creatinine 2.75, potassium 4.7, eGFR 24 mL.  TSH normal.  Total cholesterol 128, triglycerides 99, HDL 36, LDL 72.  A1c 6.5%.  Labs 11/11/2017: Potassium 4.6, serum glucose 124 mg, BUN 33, creatinine 2.0, eGFR 36 mL.  CMP otherwise normal.  A1c 5.9%.  TSH normal.  HB 10.7/HCT 33.7, platelets 143.  08/21/2017: BNP 241.4.  RBC 3.83, hemoglobin 11.2, hematocrit 35.3, normal indices, platelets 139, CBC otherwise normal.  Creatinine 2.18, EGFR 28, potassium 4.4, calcium 8.0, CMP  otherwise normal.  Cardiac Studies:    Lexiscan myoview stress test 02/20/2015: 1. The resting electrocardiogram demonstrated atypical atrial flutter, normal resting conduction, nonspecific ST-T changes.  Stress EKG is non-diagnostic for ischemia  as it a pharmacologic stress using Lexiscan. Stress symptoms included dyspnea, dizziness, arm, neck, jaw pain. 2. SPECT images demonstrate homogeneous tracer distribution throughout the myocardium. A very small sized lateral ischemia towards the apex could not be completely excluded. Overall left ventricular systolic function was normal without regional wall motion abnormalities. The left ventricular ejection fraction was 63%.  This is  a low risk stress test. No prior studies to compare.   Coronary Angiography  10/08/11: Native LAD and Cx mid occluded. SVG to Circumflex occluded (new), LIMA to LAD, SVG to D1, SVG to RI patent. (CABG 2004Dahlia Byes, MD)  Assessment   Paroxysmal atrial fibrillation (HCC) CHA2DS2-VASc Score is 5 with yearly risk of stroke of 6.7%.  CAD with stable angina pectoris ([place]) Heart cath 10/08/11: Native LAD/Cx occluded. SVG to Circumflex occluded (new), LIMA to LAD, SVG to D1, SVG to RI patent. (CABG 2004)  Coronary artery disease involving coronary bypass graft of native heart with angina pectoris ([place]) - Heart cath 10/08/11: Native LAD/Cx occluded. SVG to Circumflex occluded (new), LIMA to LAD, SVG to D1, SVG to RI patent. (CABG 2004)  Chronic diastolic heart failure ([place])  History of loop recorder [employer] ILR (implanted 04/25/15) for syncope  Diabetes mellitus with stage 4 chronic kidney disease ([place])  EKG 03/27/2018: Normal sinus rhythm at rate of 60 bpm, normal axis.  QTC 434 ms.  Nonspecific T normality. Compared to EKG 10/02/2017, non specific T is new. EKG at [facility] ER 08/06/2017: A fib with controlled ventricular response at 56 bpm, prolonged QT. Abnormal EKG.  Remote Loop transmission: 4.24.20:  NSR.    Recommendations:   Mr. Saba Neuman is a pleasant Caucasian male with history of CAD and CABG in 2004, Coronary angiogram 10/08/2011 found to have an occluded saphenous graft to the circumflex coronary artery. 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 Diagnosis of colonic cancer S/P resection of large polyp presently under oncology care.  No GI bleed.  I discussed with him again regarding weight loss, he is still a great that overall 40-50 pound weight loss but has gained about 11 pounds.  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  Clinically he does not appear to be in CHF, advised him to contact me immediately if he were to develop any leg edema or worsening dyspnea.  I reviewed his labs and updated them.  Lipids are well controlled, he does have chronic anemia with hemoglobin is remained stable.  Renal function has also remained stable.  However as he turned 78 years of age, will reduce the dose of Eliquis from 5 mg b.i.d. to 2.5 mg b.i.d. (Age 38Y and S. Cr >1.5)  Adrian Prows, MD, [facility] 09/26/2018, 6:57 AM [facility] [place] Pager: [phone] Office: [phone] If no answer Cell [phone]

## 2018-09-26 ENCOUNTER — Encounter: Payer: Self-pay | Admitting: Cardiology

## 2018-10-13 DIAGNOSIS — Z95818 Presence of other cardiac implants and grafts: Secondary | ICD-10-CM

## 2018-10-13 DIAGNOSIS — R55 Syncope and collapse: Secondary | ICD-10-CM | POA: Diagnosis not present

## 2018-10-13 DIAGNOSIS — Z4509 Encounter for adjustment and management of other cardiac device: Secondary | ICD-10-CM | POA: Diagnosis not present

## 2018-10-18 ENCOUNTER — Other Ambulatory Visit: Payer: Self-pay | Admitting: Cardiology

## 2018-10-19 NOTE — Telephone Encounter (Signed)
Please fill

## 2018-11-13 DIAGNOSIS — Z4509 Encounter for adjustment and management of other cardiac device: Secondary | ICD-10-CM

## 2018-11-13 DIAGNOSIS — R55 Syncope and collapse: Secondary | ICD-10-CM | POA: Diagnosis not present

## 2018-11-13 DIAGNOSIS — Z95818 Presence of other cardiac implants and grafts: Secondary | ICD-10-CM | POA: Diagnosis not present

## 2018-12-24 ENCOUNTER — Other Ambulatory Visit: Payer: Self-pay

## 2018-12-24 MED ORDER — APIXABAN 5 MG PO TABS: 5.0000 mg | ORAL_TABLET | Freq: Two times a day (BID) | ORAL | 1 refills | Status: DC

## 2018-12-25 ENCOUNTER — Other Ambulatory Visit: Payer: Self-pay

## 2018-12-25 DIAGNOSIS — I483 Typical atrial flutter: Secondary | ICD-10-CM

## 2018-12-25 MED ORDER — APIXABAN 5 MG PO TABS: 5.0000 mg | ORAL_TABLET | Freq: Two times a day (BID) | ORAL | 1 refills | Status: DC

## 2019-02-11 ENCOUNTER — Other Ambulatory Visit: Payer: Self-pay

## 2019-02-11 DIAGNOSIS — I483 Typical atrial flutter: Secondary | ICD-10-CM

## 2019-02-12 ENCOUNTER — Other Ambulatory Visit: Payer: Self-pay

## 2019-02-12 DIAGNOSIS — I483 Typical atrial flutter: Secondary | ICD-10-CM

## 2019-02-12 MED ORDER — APIXABAN 5 MG PO TABS: 5.0000 mg | ORAL_TABLET | ORAL | 1 refills | Status: DC

## 2019-03-02 ENCOUNTER — Other Ambulatory Visit: Payer: Self-pay | Admitting: Cardiology

## 2019-03-02 DIAGNOSIS — I483 Typical atrial flutter: Secondary | ICD-10-CM

## 2019-04-08 ENCOUNTER — Ambulatory Visit: Payer: Medicare Other | Admitting: Cardiology

## 2019-06-18 ENCOUNTER — Encounter: Payer: Self-pay | Admitting: Cardiology

## 2019-06-18 ENCOUNTER — Ambulatory Visit: Payer: Medicare Other | Admitting: Cardiology

## 2019-06-18 ENCOUNTER — Other Ambulatory Visit: Payer: Self-pay

## 2019-06-18 ENCOUNTER — Ambulatory Visit (INDEPENDENT_AMBULATORY_CARE_PROVIDER_SITE_OTHER): Payer: Medicare Other | Admitting: Cardiology

## 2019-06-18 VITALS — BP 132/58 | HR 52 | Temp 97.5°F | Ht 68.0 in | Wt 234.5 lb

## 2019-06-18 DIAGNOSIS — I5032 Chronic diastolic (congestive) heart failure: Secondary | ICD-10-CM | POA: Diagnosis not present

## 2019-06-18 DIAGNOSIS — I25118 Atherosclerotic heart disease of native coronary artery with other forms of angina pectoris: Secondary | ICD-10-CM | POA: Diagnosis not present

## 2019-06-18 DIAGNOSIS — I480 Paroxysmal atrial fibrillation: Secondary | ICD-10-CM | POA: Diagnosis not present

## 2019-06-18 DIAGNOSIS — E1122 Type 2 diabetes mellitus with diabetic chronic kidney disease: Secondary | ICD-10-CM | POA: Diagnosis not present

## 2019-06-18 DIAGNOSIS — N184 Chronic kidney disease, stage 4 (severe): Secondary | ICD-10-CM

## 2019-06-18 NOTE — Progress Notes (Signed)
Primary Physician/Referring:  Terrilee Croak, PA-C  Patient ID: [name], male    DOB: 1940/08/02, 79 y.o.   MRN: [id number]  Chief Complaint  Patient presents with  . Atrial Fibrillation  . Coronary Artery Disease  . Congestive Heart Failure  . Follow-up    23mo  HPI:    [name] is a 79y.o. 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  Previously has had loop recorder implantation which revealed A. fib, loop recorder has now reached ERI.  He has continued to be active and has been careful with his diet and has maintained weight loss.  He is presently feeling the best he has in quite a while.  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  He has not had any recurrence of GI bleeding.  He is presently doing well, but states that he slipped away from his diet and has gained weight.  He has not had any chest pain or palpitations, no further GI bleed, otherwise feels well.  He has not had any use of nitroglycerin recently.   Past Medical History:  Diagnosis Date  . Chronic kidney disease    Stage IV, nephrologist Dr. CSharyne Richters . Coronary artery disease    CABG x4 in 2004  . Diabetes ([place]   . Hyperlipidemia   . Hypertension   . Myocardial infarction ([facility]   . Obesity   . Obstructive sleep apnea   . Peripheral vascular disease ([place]    Renal artery angioplast 2004   Past Surgical History:  Procedure Laterality Date  . ANTERIOR FUSION LUMBAR SPINE    . COLONOSCOPY  09/21/2018  . CORONARY ARTERY BYPASS GRAFT   06/08/2002  . ELECTROPHYSIOLOGIC STUDY N/A 04/25/2015   Procedure: A-Flutter  Ablation;  Surgeon: JThompson Grayer MD;  [facility];  Service: Cardiovascular;  Laterality: N/A;  . ELECTROPHYSIOLOGIC STUDY N/A 06/15/2015   repeat atrial flutter ablation  . EP IMPLANTABLE DEVICE N/A 04/25/2015   Procedure: Loop Recorder Insertion;  Surgeon: JThompson Grayer MD;  [facility];  Service: Cardiovascular;  Laterality: N/A;  . LAMINECTOMY    . LEFT AND RIGHT HEART CATHETERIZATION WITH CORONARY/GRAFT ANGIOGRAM N/A 10/08/2011   Procedure: LEFT AND RIGHT HEART CATHETERIZATION WITH CBeatrix Fetters  Surgeon: JLaverda Page MD;  [facility];  Service: Cardiovascular;  Laterality: N/A;   Social History   Tobacco Use  . Smoking status: Former Smoker    Years: 25.00    Types: Cigarettes    Quit date: 12/18/2001    Years since quitting: 17.5  . Smokeless tobacco: Never Used  Substance Use Topics  . Alcohol use: No    ROS  Review of Systems  Cardiovascular: Positive for dyspnea on exertion (chronic and stable). Negative for chest pain, claudication, leg swelling, palpitations and syncope.  Respiratory: Positive for sleep disturbances due to breathing (has OSA and on CPAP).   Musculoskeletal: Positive for back pain (chronic). Negative for muscle weakness.  Gastrointestinal: Negative for change in bowel habit,  hematochezia and melena.  All other systems reviewed and are negative.  Objective  Blood pressure (!) 132/58, pulse (!) 52, temperature (!) 97.5 F (36.4 C), height '5\' 8"'  (1.727 m), weight 234 lb 8 oz (106.4 kg), SpO2 98 %.  Vitals with BMI 06/18/2019 09/25/2018 07/10/2015  Height '5\' 8"'  '5\' 7"'  '5\' 7"'   Weight 234 lbs 8 oz 212 lbs 224 lbs 10 oz  BMI 35.66 57.8 46.9  Systolic 629 528 413  Diastolic 58 64 82  Pulse 52 55 59     Physical Exam  Constitutional: He appears well-developed and well-nourished. No distress.  Moderately obese in no distress    HENT:  Head: Atraumatic.  Cardiovascular: Normal rate, regular rhythm, normal heart sounds and intact distal pulses. Exam reveals no gallop.  No murmur heard. No edema bilateral. No JVD.  Pulmonary/Chest: Effort normal and breath sounds normal.  Abdominal: Soft. Bowel sounds are normal.  Musculoskeletal:     Cervical back: Neck supple.  Skin: Skin is warm and dry.  Psychiatric: He has a normal mood and affect.   Laboratory examination:   No results for input(s): NA, K, CL, CO2, GLUCOSE, BUN, CREATININE, CALCIUM, GFRNONAA, GFRAA in the last 8760 hours. CrCl cannot be calculated (Patient's most recent lab result is older than the maximum 21 days allowed.).  CMP Latest Ref Rng & Units 06/07/2015 04/25/2015 04/18/2015  Glucose 65 - 99 mg/dL 180(H) 170(H) 143(H)  BUN 7 - 25 mg/dL 26(H) 20 18  Creatinine 0.70 - 1.18 mg/dL 1.82(H) 1.83(H) 1.67(H)  Sodium 135 - 146 mmol/L 139 142 137  Potassium 3.5 - 5.3 mmol/L 4.2 4.1 4.4  Chloride 98 - 110 mmol/L 103 104 101  CO2 20 - 31 mmol/L '24 29 28  ' Calcium 8.6 - 10.3 mg/dL 9.0 8.2(L) 8.3(L)  Total Protein 6.0 - 8.3 g/dL - - -  Total Bilirubin 0.3 - 1.2 mg/dL - - -  Alkaline Phos 39 - 117 units/L - - -  AST 0 - 37 units/L - - -  ALT 0 - 53 units/L - - -   CBC Latest Ref Rng & Units 06/07/2015 04/18/2015  WBC 4.0 - 10.5 K/uL 6.1 4.9  Hemoglobin 13.0 - 17.0 g/dL 14.3 12.6(L)  Hematocrit 39.0 - 52.0 % 42.0 36.7(L)  Platelets 150 - 400 K/uL 167 161   Lipid Panel     Component Value Date/Time   CHOL 155 02/27/2010 0823   TRIG 142.0 02/27/2010 0823   HDL 31.90 (L) 02/27/2010 0823   CHOLHDL 5 02/27/2010 0823   VLDL 28.4 02/27/2010 0823   LDLCALC 95 02/27/2010 0823   HEMOGLOBIN A1C No results found for: HGBA1C, MPG TSH No results for input(s): TSH in the last 8760 hours.  External labs :  Care Everywhere Result Report Comprehensive metabolic panelResulted: 2/44/0102 5:35 AM [facility] Component Name Value Ref Range Glucose 129  (H) 65 - 99 mg/dL BUN 37 (H) 8 - 27 mg/dL Creatinine, Serum 2.48 (H) 0.76 - 1.27 mg/dL eGFR If NonAfrican American 24 (L) >59 mL/min/1.73 eGFR If African American 27 (L) >59 mL/min/1.73 BUN/Creatinine Ratio 15 10 - 24  Sodium 139 134 - 144 mmol/L Potassium 4.8 3.5 - 5.2 mmol/L Chloride 100 96 - 106 mmol/L CO2 25 20 - 29 mmol/L CALCIUM 8.7 8.6 - 10.2 mg/dL Total Protein 6.3 6 - 8.5 g/dL Albumin, Serum 4.0 3.7 - 4.7 g/dL Globulin, Total 2.3 1.5 - 4.5 g/dL Albumin/Globulin Ratio 1.7 1.2 - 2.2  Total Bilirubin 0.6 0 - 1.2 mg/dL  Alkaline Phosphatase 120 (H) 39 - 117 IU/L AST 25 0 - 40 IU/L ALT (SGPT) 25 0 - 44 IU/L  Care Everywhere Result Report Anemia Profile B Resulted: 11/28/2018 5:35 AM [facility] Component Name Value Ref Range TIBC 304 250 - 450 ug/dL UIBC 221 111 - 343 ug/dL Iron 83 38 - 169 ug/dL Iron Saturation 27 15 - 55 % Ferritin 51 30 - 400 ng/mL Vitamin B-12 568 232 - 1,245 pg/mL Folate >20.0  Comment: A serum folate concentration of less than 3.1 ng/mL is considered to represent clinical deficiency. >3.0 ng/mL WBC 4.9 3.4 - 10.8 x10E3/uL RBC 4.11 (L) 4.14 - 5.8 x10E6/uL Hemoglobin 12.3 (L) 13 - 17.7 g/dL Hematocrit 37.2 (L) 37.5 - 51 % MCV 91 79 - 97 fL MCH 29.9 26.6 - 33 pg MCHC 33.1 31.5 - 35.7 g/dL RDW 13.5 11.6 - 15.4 % Platelet Count 133 (L) 150 - 450 x10E3/uL  Care Everywhere Result Report TSH+Free T4Resulted: 06/20/2018 5:35 AM [facility] Component Name Value Ref Range TSH 2.760 0.45 - 4.5 uIU/mL Free T4 1.17 0.82 - 1.77 ng/dL A1c 6.8%.  Labs 10/23/2018: Total cholesterol 128, triglycerides 129, HDL 31, LDL 60.   Labs 05/31/2018: HB 2.7/60 9.4, platelets 157.  BUN 44, creatinine 2.75, potassium 4.7, eGFR 24 mL.  TSH normal.  Total cholesterol 128, triglycerides 99, HDL 36, LDL 72.  A1c 6.5%.  Labs 11/11/2017: Potassium 4.6, serum glucose 124 mg, BUN 33, creatinine 2.0, eGFR 36 mL.  CMP otherwise normal.  A1c 5.9%.  TSH normal.  HB 10.7/HCT 33.7,  platelets 143.   Medications and allergies   Allergies  Allergen Reactions  . Codeine Hives    Unknown per pt Unknown per pt Unknown per pt   . Codeine Phosphate     Unknown per pt     Current Outpatient Medications  Medication Instructions  . atorvastatin (LIPITOR) 40 mg, Oral, Daily  . ELIQUIS 5 MG TABS tablet TAKE 1 TABLET BY MOUTH  TWICE DAILY  . FLUoxetine (PROZAC) 20 mg, Daily  . furosemide (LASIX) 40 MG tablet TAKE 1 TABLET BY MOUTH  EVERY MORNING  . HYDROcodone-acetaminophen (NORCO) 7.5-325 MG tablet 1 tablet, Oral, Every 8 hours PRN  . isosorbide mononitrate (IMDUR) 60 MG 24 hr tablet TAKE 1 TABLET BY MOUTH  DAILY  . metoprolol succinate (TOPROL-XL) 50 MG 24 hr tablet TAKE 1 TABLET BY MOUTH  DAILY  . nitroGLYCERIN (NITROSTAT) 0.4 mg, Sublingual, Every 5 min PRN, For chest pain  . omeprazole (PRILOSEC) 20 mg, Daily  . ranolazine (RANEXA) 500 mg, Oral, Daily  . tamsulosin (FLOMAX) 0.4 MG CAPS capsule 1 capsule, Oral, Daily at bedtime  . vitamin C 1,000 mg, Oral, Daily  . Vitamin D, Ergocalciferol, (DRISDOL) 1.25 MG (50000 UT) CAPS capsule 1 capsule, Oral, Weekly    Radiology:  No results found.  Cardiac Studies:   Coronary Angiography  10-24-11: Native LAD and Cx mid occluded. SVG to Circumflex occluded (new), LIMA to LAD, SVG to D1, SVG to RI patent. (CABG 2004- Dahlia Byes, MD)  Leane Call stress test 02/20/2015: 1. The resting electrocardiogram demonstrated atypical atrial flutter, normal resting conduction, nonspecific ST-T changes.  Stress EKG is non-diagnostic for ischemia as it a pharmacologic stress using Lexiscan. Stress symptoms included dyspnea, dizziness, arm, neck, jaw pain. 2. SPECT images demonstrate homogeneous tracer distribution throughout the myocardium. A very small sized lateral ischemia towards the apex could not be completely excluded. Overall left ventricular systolic function was normal without regional wall motion  abnormalities. The  left ventricular ejection fraction was 63%.  This is  a low risk stress test. No prior studies to compare.  Assessment     ICD-10-CM   1. Paroxysmal atrial fibrillation (HCC) CHA2DS2-VASc Score is 5 with yearly risk of stroke of 6.7%.  I48.0 EKG 12-Lead  2. CAD with stable angina pectoris ([place]) Heart cath 10/08/11: Native LAD/Cx occluded. SVG to Circumflex occluded (new), LIMA to LAD, SVG to D1, SVG to RI patent. (CABG 2004)  I25.118   3. Chronic diastolic heart failure (HCC)  I50.32   4. Diabetes mellitus with stage 4 chronic kidney disease ([place])  E11.22    N18.4     EKG 06/18/2019: Probably normal sinus rhythm at the rate of 52 bpm, normal axis, nonspecific T wave abnormality.  No significant change from EKG 03/27/2018   No orders of the defined types were placed in this encounter.   Medications Discontinued During This Encounter  Medication Reason  . metoprolol tartrate (LOPRESSOR) 50 MG tablet Change in therapy  . oxyCODONE-acetaminophen (PERCOCET) 5-325 MG per tablet Change in therapy  . metFORMIN (GLUCOPHAGE) 1000 MG tablet Discontinued by provider  . ketorolac (ACULAR) 0.5 % ophthalmic solution Completed Course  . amLODipine (NORVASC) 2.5 MG tablet Completed Course     Recommendations:   [name]  is a 79 y.o. 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  Previously has had loop recorder implantation which revealed A. fib, loop recorder has now reached ERI.  This is his annual visit, he has gained weight but fortunately he has not been any clinical heart failure, he needs labs and has been scheduled for a complete physical examination in the  next 2 weeks.  Renal function has remained stable.  He has not had any further GI bleeding as well.  He has been compliant with CPAP as well.  Today he is maintaining sinus rhythm with no change in his EKG. Tolerating eliquis.   No angina pectoris.  As he has gained weight to improve compliance I would like to see him back in 3 months for follow-up.  Extensive discussion was held regarding making dietary changes again.  Adrian Prows, MD, [facility] 06/18/2019, 12:38 PM [facility]. [place] Office: [phone]

## 2019-06-25 DIAGNOSIS — I509 Heart failure, unspecified: Secondary | ICD-10-CM | POA: Insufficient documentation

## 2019-06-25 DIAGNOSIS — F331 Major depressive disorder, recurrent, moderate: Secondary | ICD-10-CM | POA: Insufficient documentation

## 2019-06-25 DIAGNOSIS — C182 Malignant neoplasm of ascending colon: Secondary | ICD-10-CM | POA: Insufficient documentation

## 2019-09-17 ENCOUNTER — Ambulatory Visit: Payer: Medicare Other | Admitting: Cardiology

## 2019-10-14 ENCOUNTER — Encounter: Payer: Self-pay | Admitting: Cardiology

## 2019-10-14 ENCOUNTER — Ambulatory Visit: Payer: Medicare Other | Admitting: Cardiology

## 2019-10-14 ENCOUNTER — Other Ambulatory Visit: Payer: Self-pay

## 2019-10-14 VITALS — BP 128/55 | HR 60 | Resp 15 | Ht 67.0 in | Wt 238.0 lb

## 2019-10-14 DIAGNOSIS — I480 Paroxysmal atrial fibrillation: Secondary | ICD-10-CM

## 2019-10-14 DIAGNOSIS — M170 Bilateral primary osteoarthritis of knee: Secondary | ICD-10-CM

## 2019-10-14 DIAGNOSIS — I25118 Atherosclerotic heart disease of native coronary artery with other forms of angina pectoris: Secondary | ICD-10-CM

## 2019-10-14 DIAGNOSIS — I5032 Chronic diastolic (congestive) heart failure: Secondary | ICD-10-CM

## 2019-10-14 MED ORDER — TRAMADOL HCL 50 MG PO TABS: 50.0000 mg | ORAL_TABLET | Freq: Four times a day (QID) | ORAL | 3 refills | Status: DC | PRN

## 2019-10-14 NOTE — Progress Notes (Signed)
Primary Physician/Referring:  Terrilee Croak, PA-C  Patient ID: [name], male    DOB: May 25, 1940, 79 y.o.   MRN: [id number]  Chief Complaint  Patient presents with  . Follow-up    3 month  . Coronary Artery Disease  . Hypertension  . Hyperlipidemia   HPI:    [name]  is a 79 y.o. 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  He has not had any recurrence of GI bleeding.  He is presently doing well, but states that he slipped away from his diet and has gained weight.  He has not had any chest pain or palpitations, no further GI bleed, otherwise feels well.  He has not had any use of nitroglycerin recently.  Past Medical History:  Diagnosis Date  . Chronic kidney disease    Stage IV, nephrologist Dr. CSharyne Richters . Coronary artery disease    CABG x4 in 2004  . Diabetes ([place]   . Hyperlipidemia   . Hypertension   . Myocardial infarction ([place]   . Obesity   . Obstructive sleep apnea   . Peripheral vascular disease ([place]    Renal artery angioplast 2004   Past Surgical History:  Procedure Laterality Date  . ANTERIOR FUSION LUMBAR SPINE    . COLONOSCOPY  09/21/2018  . CORONARY ARTERY BYPASS GRAFT  06/08/2002  . ELECTROPHYSIOLOGIC STUDY N/A 04/25/2015   Procedure: A-Flutter  Ablation;  Surgeon: JThompson Grayer MD;  [facility];  Service: Cardiovascular;  Laterality: N/A;  . ELECTROPHYSIOLOGIC STUDY N/A 06/15/2015   repeat atrial flutter ablation    . EP IMPLANTABLE DEVICE N/A 04/25/2015   Procedure: Loop Recorder Insertion;  Surgeon: JThompson Grayer MD;  [facility];  Service: Cardiovascular;  Laterality: N/A;  . LAMINECTOMY    . LEFT AND RIGHT HEART CATHETERIZATION WITH CORONARY/GRAFT ANGIOGRAM N/A 10/08/2011   Procedure: LEFT AND RIGHT HEART CATHETERIZATION WITH CBeatrix Fetters  Surgeon: JLaverda Page MD;  [facility];  Service: Cardiovascular;  Laterality: N/A;   Social History   Tobacco Use  . Smoking status: Former Smoker    Years: 25.00    Types: Cigarettes    Quit date: 12/18/2001    Years since quitting: 17.8  . Smokeless tobacco: Never Used  Substance Use Topics  . Alcohol use: No    ROS  Review of Systems  Cardiovascular: Positive for dyspnea on exertion (chronic and stable). Negative for chest pain, claudication, leg swelling, palpitations and syncope.  Respiratory: Positive for sleep disturbances due to breathing (has OSA and on CPAP).   Musculoskeletal: Positive for back pain (chronic). Negative for muscle weakness.  Gastrointestinal: Negative for melena.  All other systems reviewed and are negative.  Objective  Blood pressure (!) 128/55, pulse 60, resp. rate 15, height '5\' 7"'  (1.702 m), weight 238 lb (108 kg), SpO2 96 %.  Vitals with BMI 10/14/2019 06/18/2019 09/25/2018  Height '5\' 7"'  '5\' 8"'  '5\' 7"'   Weight 238 lbs 234  lbs 8 oz 212 lbs  BMI 37.27 29.02 11.1  Systolic 552 080 223  Diastolic 55 58 64  Pulse 60 52 55     Physical Exam  Constitutional:  Moderately obese in no distress  Cardiovascular: Normal rate, regular rhythm, normal heart sounds and intact distal pulses. Exam reveals no gallop.  No murmur heard. Pulses:      Carotid pulses are 2+ on the right side and 2+ on the left side.      Dorsalis pedis pulses are 1+ on the right side and 1+ on the left side.       Posterior tibial pulses are 1+ on the right side and 1+ on the left side.  No edema bilateral. No JVD.   Pulmonary/Chest: Effort normal and breath sounds normal.  Abdominal: Soft. Bowel sounds are normal.   Laboratory examination:   External labs :  Care Everywhere Result Report Labs 09/24/2019:  Hb 13.2/HCT 38.1, platelets 122, stable on chronic.  BUN 30, creatinine 2.49, EGFR 23 mL.  Potassium 4.9.  Serum glucose 137 mg.  CMP normal otherwise.  Total cholesterol 139, triglycerides 120, HDL 40, LDL 77.  A1c is 7.2%.  TSH+Free T4Resulted: 06/20/2018 5:35 AM [facility] Component Name Value Ref Range TSH 2.760 0.45 - 4.5 uIU/mL Free T4 1.17 0.82 - 1.77 ng/dL A1c 6.8%.  Labs 10/23/2018: Total cholesterol 128, triglycerides 129, HDL 31, LDL 60.   Labs 05/31/2018: HB 2.7/60 9.4, platelets 157.  BUN 44, creatinine 2.75, potassium 4.7, eGFR 24 mL.  TSH normal.  Total cholesterol 128, triglycerides 99, HDL 36, LDL 72.  A1c 6.5%.  Labs 11/11/2017: Potassium 4.6, serum glucose 124 mg, BUN 33, creatinine 2.0, eGFR 36 mL.  CMP otherwise normal.  A1c 5.9%.  TSH normal.  HB 10.7/HCT 33.7, platelets 143.  Medications and allergies   Allergies  Allergen Reactions  . Codeine Hives    Unknown per pt Unknown per pt Unknown per pt   . Codeine Phosphate     Unknown per pt     Current Outpatient Medications  Medication Instructions  . atorvastatin (LIPITOR) 40 mg, Oral, Daily  . Blood Glucose Monitoring Suppl (ONETOUCH VERIO REFLECT) w/Device KIT USE TO CHECK BLOOD SUGAR AS DIRECTED  . Blood Glucose Monitoring Suppl (ONETOUCH VERIO) w/Device KIT Does not apply  . ELIQUIS 5 MG TABS tablet TAKE 1 TABLET BY MOUTH  TWICE DAILY  . FLUoxetine (PROZAC) 20 mg, Daily  . furosemide (LASIX) 40 MG tablet TAKE 1 TABLET BY MOUTH  EVERY MORNING  . HYDROcodone-acetaminophen (NORCO) 7.5-325 MG tablet 1 tablet, Oral, Every 8 hours PRN  . Iron-Vitamin C (IRON 100/C PO) 100 mg, Oral, Daily  . isosorbide mononitrate (IMDUR) 60 MG 24 hr tablet TAKE 1 TABLET BY MOUTH  DAILY  . metoprolol succinate (TOPROL-XL)  50 MG 24 hr tablet TAKE 1 TABLET BY MOUTH  DAILY  . nitroGLYCERIN (NITROSTAT) 0.4 mg, Sublingual, Every 5 min PRN, For chest pain  . omeprazole (PRILOSEC) 20 mg, Daily  . ranolazine (RANEXA) 500 mg, Oral, Daily  . tamsulosin (FLOMAX) 0.4 MG CAPS capsule 1 capsule, Oral, Daily at bedtime  . traMADol (ULTRAM) 50 mg, Oral, Every 6 hours PRN  . vitamin C 1,000 mg, Oral, Daily  . Vitamin D, Ergocalciferol, (DRISDOL) 1.25 MG (50000 UT) CAPS capsule 1 capsule, Oral, Weekly    Radiology:  No results found.  Cardiac Studies:   Coronary Angiography  Oct 18, 2011: Native LAD and Cx mid occluded. SVG to Circumflex occluded (new), LIMA  to LAD, SVG to D1, SVG to RI patent. (CABG 2004- Dahlia Byes, MD)  Leane Call stress test 02/20/2015: 1. The resting electrocardiogram demonstrated atypical atrial flutter, normal resting conduction, nonspecific ST-T changes.  Stress EKG is non-diagnostic for ischemia as it a pharmacologic stress using Lexiscan. Stress symptoms included dyspnea, dizziness, arm, neck, jaw pain. 2. SPECT images demonstrate homogeneous tracer distribution throughout the myocardium. A very small sized lateral ischemia towards the apex could not be completely excluded. Overall left ventricular systolic function was normal without regional wall motion abnormalities. The left ventricular ejection fraction was 63%.  This is  a low risk stress test. No prior studies to compare.  EKG  06/18/2019: Probably normal sinus rhythm at the rate of 52 bpm, normal axis, nonspecific T wave abnormality.  No significant change from EKG 03/27/2018   Assessment     ICD-10-CM   1. Paroxysmal atrial fibrillation (HCC) CHA2DS2-VASc Score is 5 with yearly risk of stroke of 6.7%.  I48.0   2. CAD with stable angina pectoris ([place])   I25.118   3. Chronic diastolic heart failure (HCC)  I50.32   4. Primary osteoarthritis of both knees  M17.0 traMADol (ULTRAM) 50 MG tablet     Meds ordered this encounter    Medications  . traMADol (ULTRAM) 50 MG tablet    Sig: Take 1 tablet (50 mg total) by mouth every 6 (six) hours as needed.    Dispense:  60 tablet    Refill:  3    There are no discontinued medications.   Recommendations:   Takeem Krotzer  is a 79 y.o. 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  This is his annual visit, he has gained weight but fortunately he has not been any clinical heart failure, he remains asymptomatic without palpitations or fatigue or recurrence of diastolic heart failure, I reviewed his external labs, except for progression of renal disease to stage IV chronic kidney disease, CBC has remained stable.  Continue anticoagulation.  He states that his weight gain is related to decreased mobility from bilateral knee degenerative joint disease.  He should consider surgery if this is limiting him, however I will try tramadol as Percocet that patient is on is not helping and he is not using it as much as it does not help him.  He has been compliant with CPAP as well. Tolerating eliquis with no GI bleed.   No angina pectoris. Extensive discussion was held regarding making dietary changes again.  I will see him back on annual basis.  JAdrian Prows MD, [facility]10/14/2019, 10:46 PM [facility] [place] Pager: [phone] Office: [phone]

## 2020-01-14 ENCOUNTER — Encounter: Payer: Self-pay | Admitting: Cardiology

## 2020-01-14 ENCOUNTER — Telehealth: Payer: Self-pay

## 2020-01-14 ENCOUNTER — Other Ambulatory Visit: Payer: Self-pay

## 2020-01-14 ENCOUNTER — Ambulatory Visit: Payer: Medicare Other | Admitting: Cardiology

## 2020-01-14 ENCOUNTER — Ambulatory Visit: Payer: Medicare Other

## 2020-01-14 VITALS — BP 122/69 | HR 56 | Resp 16 | Ht 67.0 in | Wt 238.0 lb

## 2020-01-14 DIAGNOSIS — M170 Bilateral primary osteoarthritis of knee: Secondary | ICD-10-CM

## 2020-01-14 DIAGNOSIS — R55 Syncope and collapse: Secondary | ICD-10-CM

## 2020-01-14 DIAGNOSIS — I480 Paroxysmal atrial fibrillation: Secondary | ICD-10-CM

## 2020-01-14 DIAGNOSIS — I951 Orthostatic hypotension: Secondary | ICD-10-CM

## 2020-01-14 DIAGNOSIS — I5032 Chronic diastolic (congestive) heart failure: Secondary | ICD-10-CM

## 2020-01-14 MED ORDER — TRAMADOL HCL 50 MG PO TABS: 50.0000 mg | ORAL_TABLET | Freq: Four times a day (QID) | ORAL | 3 refills | Status: DC | PRN

## 2020-01-14 MED ORDER — METOPROLOL SUCCINATE ER 50 MG PO TB24: 25.0000 mg | ORAL_TABLET | Freq: Every day | ORAL | 0 refills | Status: DC

## 2020-01-14 MED ORDER — FUROSEMIDE 40 MG PO TABS: 20.0000 mg | ORAL_TABLET | ORAL | Status: DC

## 2020-01-14 NOTE — Progress Notes (Signed)
Primary Physician/Referring:  Terrilee Croak, PA-C  Patient ID: [name], male    DOB: 1941/04/15, 79 y.o.   MRN: [id number]  Chief Complaint  Patient presents with  . Paroxysmal Atrial Fibrillation  . Dizziness  . Follow-up    3 month   HPI:    [name]  is a 79 y.o. 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  He has not had any recurrence of GI bleeding.  This is a sick work in follow-up due to recurrent episodes of syncope.  Patient has had about 3-4 episodes of syncope, 2 episodes where he felt dizzy and then passed out but 2 episodes where he was asymptomatic and suddenly passed out without any premonitory symptoms.  He is accompanied by his son at the bedside.  He has also noticed gradual decrease in exercise tolerance.  He has not had any chest pain, PND orthopnea, leg edema.  Denies dyspnea but states that his exercise capacity has markedly reduced.  Past Medical History:  Diagnosis Date  . Chronic kidney disease    Stage IV, nephrologist Dr. CSharyne Richters . Coronary artery disease    CABG x4 in 2004  . [place] ([place]   . Hyperlipidemia   . Hypertension   . [place]ocardial infarction ([place]   . Obesity   . Obstructive sleep apnea   . Peripheral vascular [place] ([place]    Renal artery angioplast 2004   Past Surgical History:  Procedure Laterality Date  . ANTERIOR FUSION LUMBAR SPINE    . COLONOSCOPY  09/21/2018  . CORONARY ARTERY BYPASS GRAFT  06/08/2002    . ELECTROPHYSIOLOGIC STUDY N/A 04/25/2015   Procedure: A-Flutter  Ablation;  Surgeon: JThompson Grayer MD;  [facility];  Service: Cardiovascular;  Laterality: N/A;  . ELECTROPHYSIOLOGIC STUDY N/A 06/15/2015   repeat atrial flutter ablation  . EP IMPLANTABLE DEVICE N/A 04/25/2015   Procedure: Loop Recorder Insertion;  Surgeon: JThompson Grayer MD;  [facility];  Service: Cardiovascular;  Laterality: N/A;  . LAMINECTOMY    . LEFT AND RIGHT HEART CATHETERIZATION WITH CORONARY/GRAFT ANGIOGRAM N/A 10/08/2011   Procedure: LEFT AND RIGHT HEART CATHETERIZATION WITH CBeatrix Fetters  Surgeon: JLaverda Page MD;  [facility];  Service: Cardiovascular;  Laterality: N/A;   Social History   Tobacco Use  . Smoking status: Former Smoker    Years: 25.00    Types: Cigarettes    Quit date: 12/18/2001    Years since quitting: 18.0  . Smokeless tobacco: Never Used  Substance Use Topics  . Alcohol use: No    ROS  Review of Systems  Cardiovascular: Positive for dyspnea on exertion (chronic and stable) and syncope. Negative for chest pain, claudication, leg swelling and palpitations.  Respiratory: Positive for sleep disturbances due to breathing (has OSA and on CPAP).   Musculoskeletal: Positive for back pain (chronic). Negative for muscle weakness.  Gastrointestinal: Negative for melena.  All other systems reviewed and are negative.  Objective  Blood pressure 122/69, pulse (!) 56, resp. rate 16, height '5\' 7"'  (1.702 m), weight 238 lb (108 kg), SpO2 96 %.  Vitals with BMI 01/14/2020 10/14/2019 06/18/2019  Height '5\' 7"'  '5\' 7"'  '5\' 8"'   Weight 238 lbs 238 lbs 234 lbs 8 oz  BMI 37.27 33.54 56.25  Systolic 638 937 342  Diastolic 69 55 58  Pulse 56 60 52    Orthostatic VS for the past 72 hrs (Last 3 readings):  Orthostatic BP Patient Position BP Location Cuff Size Orthostatic Pulse  01/14/20 0913 105/47 Standing Left Arm Large 65  01/14/20 0912 120/62 Sitting  Left Arm Large 71  01/14/20 0910 139/68 Supine Left Arm Large 72     Physical Exam Constitutional:      Comments: Moderately obese in no distress  Cardiovascular:     Rate and Rhythm: Normal rate. Rhythm irregular.     Pulses: Intact distal pulses.          Carotid pulses are 2+ on the right side and 2+ on the left side.      Dorsalis pedis pulses are 1+ on the right side and 1+ on the left side.       Posterior tibial pulses are 1+ on the right side and 1+ on the left side.     Heart sounds: Normal heart sounds. No murmur heard.  No gallop.      Comments: No edema bilateral. No JVD. Pulmonary:     Effort: Pulmonary effort is normal.     Breath sounds: Normal breath sounds.  Abdominal:     General: Bowel sounds are normal.     Palpations: Abdomen is soft.    Laboratory examination:   External labs :  Care Everywhere Result Report Labs 09/24/2019:  Hb 13.2/HCT 38.1, platelets 122, stable on chronic.  BUN 30, creatinine 2.49, EGFR 23 mL.  Potassium 4.9.  Serum glucose 137 mg.  CMP normal otherwise.  Total cholesterol 139, triglycerides 120, HDL 40, LDL 77.  A1c is 7.2%.  TSH+Free T4Resulted: 06/20/2018 5:35 AM [facility] Component Name Value Ref Range TSH 2.760 0.45 - 4.5 uIU/mL Free T4 1.17 0.82 - 1.77 ng/dL A1c 6.8%.  Labs 10/23/2018: Total cholesterol 128, triglycerides 129, HDL 31, LDL 60.   Labs 05/31/2018: HB 2.7/60 9.4, platelets 157.  BUN 44, creatinine 2.75, potassium 4.7, eGFR 24 mL.  TSH normal.  Total cholesterol 128, triglycerides 99, HDL 36, LDL 72.  A1c 6.5%.  Labs 11/11/2017: Potassium 4.6, serum glucose 124 mg, BUN 33, creatinine 2.0, eGFR 36 mL.  CMP otherwise normal.  A1c 5.9%.  TSH normal.  HB 10.7/HCT 33.7, platelets 143.  Medications and allergies   Allergies  Allergen Reactions  . Codeine Hives    Unknown per pt Unknown per pt Unknown per pt   . Codeine Phosphate     Unknown per pt     Current Outpatient Medications  Medication  Instructions  . atorvastatin (LIPITOR) 40 mg, Oral, Daily  . Blood Glucose Monitoring Suppl (ONETOUCH VERIO REFLECT) w/Device KIT USE TO CHECK BLOOD SUGAR AS DIRECTED  . Blood Glucose Monitoring Suppl (ONETOUCH VERIO) w/Device KIT Does not apply  . ELIQUIS 5 MG TABS tablet TAKE 1 TABLET BY MOUTH  TWICE DAILY  . FLUoxetine (PROZAC) 20 mg, Daily  . furosemide (LASIX) 20 mg, Oral, Every other day, May take 39m prn edema  . HYDROcodone-acetaminophen (NORCO) 7.5-325 MG tablet 1 tablet, Oral, Every 8 hours PRN  . Iron-Vitamin C (IRON 100/C  PO) 100 mg, Oral, Daily  . isosorbide mononitrate (IMDUR) 60 MG 24 hr tablet TAKE 1 TABLET BY MOUTH  DAILY  . metoprolol succinate (TOPROL-XL) 25 mg, Oral, Daily, Take with or immediately following a meal.  . nitroGLYCERIN (NITROSTAT) 0.4 mg, Sublingual, Every 5 min PRN, For chest pain  . omeprazole (PRILOSEC) 20 mg, Daily  . tamsulosin (FLOMAX) 0.4 MG CAPS capsule 1 capsule, Oral, Daily at bedtime  . traMADol (ULTRAM) 50 mg, Oral, Every 6 hours PRN  . vitamin C 1,000 mg, Oral, Daily  . Vitamin D, Ergocalciferol, (DRISDOL) 1.25 MG (50000 UT) CAPS capsule 1 capsule, Oral, Weekly    Radiology:  No results found.  Cardiac Studies:   Coronary Angiography  October 30, 2011: Native LAD and Cx mid occluded. SVG to Circumflex occluded (new), LIMA to LAD, SVG to D1, SVG to RI patent. (CABG 2004- Dahlia Byes, MD)  Leane Call stress test 02/20/2015: 1. The resting electrocardiogram demonstrated atypical atrial flutter, normal resting conduction, nonspecific ST-T changes.  Stress EKG is non-diagnostic for ischemia as it a pharmacologic stress using Lexiscan. Stress symptoms included dyspnea, dizziness, arm, neck, jaw pain. 2. SPECT images demonstrate homogeneous tracer distribution throughout the myocardium. A very small sized lateral ischemia towards the apex could not be completely excluded. Overall left ventricular systolic function was normal without regional wall  motion abnormalities. The left ventricular ejection fraction was 63%.  This is  a low risk stress test. No prior studies to compare.  EKG  EKG 01/14/2020: Atrial fibrillation with controlled ventricular response at the rate of 59 bpm, normal axis.  Incomplete right bundle branch block.  Nonspecific T abnormality.    06/18/2019: Probably normal sinus rhythm at the rate of 52 bpm, normal axis, nonspecific T wave abnormality.  No significant change from EKG 03/27/2018   Assessment     ICD-10-CM   1. Chronic diastolic heart failure (HCC)  I50.32 metoprolol succinate (TOPROL-XL) 50 MG 24 hr tablet    furosemide (LASIX) 40 MG tablet  2. Syncope and collapse  R55 EKG 12-Lead    LONG TERM MONITOR-LIVE TELEMETRY (3-14 DAYS)  3. Paroxysmal atrial fibrillation (HCC)  I48.0   4. Orthostatic hypotension  I95.1   5. Primary osteoarthritis of both knees  M17.0 traMADol (ULTRAM) 50 MG tablet     Meds ordered this encounter  Medications  . metoprolol succinate (TOPROL-XL) 50 MG 24 hr tablet    Sig: Take 0.5 tablets (25 mg total) by mouth daily. Take with or immediately following a meal.    Dispense:  90 tablet    Refill:  0  . furosemide (LASIX) 40 MG tablet    Sig: Take 0.5 tablets (20 mg total) by mouth every other day. May take 67m prn edema  . traMADol (ULTRAM) 50 MG tablet    Sig: Take 1 tablet (50 mg total) by mouth every 6 (six) hours as needed.    Dispense:  60 tablet    Refill:  3    Medications Discontinued During This Encounter  Medication Reason  . ranolazine (RANEXA) 1000 MG SR tablet Discontinued by provider  . furosemide (LASIX) 40 MG tablet   . metoprolol succinate (TOPROL-XL) 50 MG 24 hr tablet   . traMADol (ULTRAM) 50 MG tablet Reorder     Recommendations:   [name] is a 79y.o. 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  This is a sick work in evaluation for recurrent episodes of syncope, he is in persistent atrial fibrillation/atrial flutter, I do not think that this should cause him to have syncope with and without premonitory symptoms.  He was orthostatic today.  Advised him to reduce furosemide to 20 mg every other day and continue with 40 mg on a as needed basis if he has leg edema or dyspnea.  We will also decrease metoprolol succinate from 25 g daily to 12.5 mg daily.  I would like to perform 2-week extended Holter monitor/Mobile cardiac telemetry in view of significant symptoms of syncope which may be related to underlying high conduction system disease versus cardiac arrhythmias related to underlying coronary artery disease.  I have refilled his Ultram for degenerative joint disease.  I will see him back in 2 to 3 weeks for follow-up.  I may consider loop recorder implantation in view of unexplained syncope if telemetry is unrevealing.  With regard to decreased exercise capacity, it may be related to his deconditioning and obesity, with A. fib and CAD contributing.  Will consider pulmonary rehab.  I spent 40 minutes with the patient and his son regarding discussions regarding his complex medical issues including sinus node dysfunction, AV nodal blockade, need for pulmonary rehab, weight loss, anticoagulation and CAD and orthostatic hypotension.   JAdrian Prows MD, [facility]8/27/2021, 9:42 PM Office: [phone]

## 2020-02-01 ENCOUNTER — Telehealth: Payer: Self-pay | Admitting: Cardiology

## 2020-02-01 DIAGNOSIS — R55 Syncope and collapse: Secondary | ICD-10-CM

## 2020-02-01 DIAGNOSIS — I495 Sick sinus syndrome: Secondary | ICD-10-CM

## 2020-02-01 DIAGNOSIS — I480 Paroxysmal atrial fibrillation: Secondary | ICD-10-CM

## 2020-02-02 ENCOUNTER — Telehealth: Payer: Self-pay

## 2020-02-02 NOTE — Telephone Encounter (Signed)
Error

## 2020-02-04 NOTE — Telephone Encounter (Signed)
Spoke with son Barnabas Lister, will wait for call about referral.

## 2020-02-06 NOTE — Telephone Encounter (Signed)
ICD-10-CM   1. Sinus node dysfunction (HCC)  I49.5 Ambulatory referral to Cardiac Electrophysiology  2. Syncope and collapse  R55 Ambulatory referral to Cardiac Electrophysiology  3. Paroxysmal atrial fibrillation (HCC)  I48.0 Ambulatory referral to Cardiac Electrophysiology  4. Sick sinus syndrome (HCC)  I49.5     Referral placed to Dr. Rayann Heman for consideration for AF ablation and possible pacemaker implantation. Zio tracing will be uploaded in CV procedures.   Zio Patch Remote outpatient cardiac telemetry 14 days 01/14/2020:  Unscheduled (Alert) 01/17/2020: Probably artifact vs >3 Sec pause with underlying AF with slow ventricular response.  01/22/2020: Symptomatic event correlates with 5.4 Sec pause.     Adrian Prows, MD, [facility] 02/06/2020, 8:39 AM Office: [phone]

## 2020-02-07 ENCOUNTER — Ambulatory Visit: Payer: Medicare Other | Admitting: Cardiology

## 2020-02-09 ENCOUNTER — Other Ambulatory Visit: Payer: Self-pay

## 2020-02-09 ENCOUNTER — Encounter: Payer: Self-pay | Admitting: Cardiology

## 2020-02-09 ENCOUNTER — Ambulatory Visit: Payer: Medicare Other | Admitting: Cardiology

## 2020-02-09 VITALS — BP 138/72 | HR 67 | Resp 16 | Ht 67.0 in | Wt 238.0 lb

## 2020-02-09 DIAGNOSIS — R55 Syncope and collapse: Secondary | ICD-10-CM

## 2020-02-09 DIAGNOSIS — I442 Atrioventricular block, complete: Secondary | ICD-10-CM

## 2020-02-09 DIAGNOSIS — I480 Paroxysmal atrial fibrillation: Secondary | ICD-10-CM

## 2020-02-09 NOTE — Progress Notes (Signed)
Primary Physician/Referring:  Terrilee Croak, PA-C  Patient ID: [name], male    DOB: Sep 30, 1940, 79 y.o.   MRN: [id number]  Chief Complaint  Patient presents with  . Congestive Heart Failure  . Atrial Fibrillation  . Loss of Consciousness  . Follow-up    2 week   HPI:    [name]  is a 79 y.o. 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  He has not had any recurrence of GI bleeding.  This is a 4 week follow up from a sick work in appointment for recurrent episodes of syncope. He has continued having dizzy spells.  He notices this most with changing positions or straining with a BM. He has had two episodes of loss of consciousness. He also notes he has had increased shortness of breath. Denies chest pain, leg edema.   Past Medical History:  Diagnosis Date  . Chronic kidney disease    Stage IV, nephrologist Dr. CSharyne Richters . Coronary artery disease    CABG x4 in 2004  . Diabetes ([place]   . Hyperlipidemia   . Hypertension   . Myocardial infarction ([place]   . Obesity   . Obstructive sleep apnea   . Peripheral vascular disease ([place]    Renal artery angioplast 2004   Past Surgical History:  Procedure Laterality Date  . ANTERIOR FUSION LUMBAR SPINE    . COLONOSCOPY  09/21/2018  . CORONARY ARTERY BYPASS GRAFT  06/08/2002  . ELECTROPHYSIOLOGIC STUDY N/A 04/25/2015   Procedure: A-Flutter  Ablation;  Surgeon: JThompson Grayer MD;  [facility];  Service:  Cardiovascular;  Laterality: N/A;  . ELECTROPHYSIOLOGIC STUDY N/A 06/15/2015   repeat atrial flutter ablation  . EP IMPLANTABLE DEVICE N/A 04/25/2015   Procedure: Loop Recorder Insertion;  Surgeon: JThompson Grayer MD;  [facility];  Service: Cardiovascular;  Laterality: N/A;  . LAMINECTOMY    . LEFT AND RIGHT HEART CATHETERIZATION WITH CORONARY/GRAFT ANGIOGRAM N/A 10/08/2011   Procedure: LEFT AND RIGHT HEART CATHETERIZATION WITH CBeatrix Fetters  Surgeon: JLaverda Page MD;  [facility];  Service: Cardiovascular;  Laterality: N/A;   Social History   Tobacco Use  . Smoking status: Former Smoker    Years: 25.00    Types: Cigarettes    Quit date: 12/18/2001    Years since quitting: 18.1  . Smokeless tobacco: Never Used  Substance Use Topics  . Alcohol use: No    ROS  Review of Systems  Cardiovascular: Positive for dyspnea on exertion (chronic) and syncope. Negative for chest pain, claudication, leg swelling and palpitations.  Respiratory: Positive for sleep disturbances due to breathing (has OSA and on CPAP).   Musculoskeletal: Positive for back pain (chronic). Negative for muscle weakness.  Gastrointestinal: Negative for melena.  All other systems reviewed and are negative.  Objective  Blood pressure 138/72, pulse 67, resp. rate 16, height '5\' 7"'  (1.702 m), weight 238 lb (108 kg), SpO2 96 %.  Vitals with  BMI 02/09/2020 01/14/2020 10/14/2019  Height '5\' 7"'  '5\' 7"'  '5\' 7"'   Weight 238 lbs 238 lbs 238 lbs  BMI 37.27 21.19 41.74  Systolic 081 448 185  Diastolic 72 69 55  Pulse 67 56 60    Orthostatic VS for the past 72 hrs (Last 3 readings):  Orthostatic BP Patient Position BP Location Cuff Size Orthostatic Pulse  02/09/20 1357 128/55 Standing Left Arm Large 60  02/09/20 1355 153/80 Sitting Left Arm Large 66  02/09/20 1354 159/89 Supine Left Arm Large 50     Physical Exam Constitutional:      Comments: Moderately obese in no distress  Cardiovascular:      Rate and Rhythm: Normal rate. Rhythm irregular.     Pulses: Intact distal pulses.          Carotid pulses are 2+ on the right side and 2+ on the left side.      Dorsalis pedis pulses are 1+ on the right side and 1+ on the left side.       Posterior tibial pulses are 1+ on the right side and 1+ on the left side.     Heart sounds: Normal heart sounds. No murmur heard.  No gallop.      Comments: No edema bilateral. No JVD. Pulmonary:     Effort: Pulmonary effort is normal.     Breath sounds: Normal breath sounds.  Abdominal:     General: Bowel sounds are normal.     Palpations: Abdomen is soft.    Laboratory examination:   External labs :  Care Everywhere Result Report Labs 09/24/2019:  Hb 13.2/HCT 38.1, platelets 122, stable on chronic.  BUN 30, creatinine 2.49, EGFR 23 mL.  Potassium 4.9.  Serum glucose 137 mg.  CMP normal otherwise.  Total cholesterol 139, triglycerides 120, HDL 40, LDL 77.  A1c is 7.2%.  TSH+Free T4Resulted: 06/20/2018 5:35 AM [facility] Component Name Value Ref Range TSH 2.760 0.45 - 4.5 uIU/mL Free T4 1.17 0.82 - 1.77 ng/dL A1c 6.8%.  Labs 10/23/2018: Total cholesterol 128, triglycerides 129, HDL 31, LDL 60.   Labs 05/31/2018: HB 2.7/60 9.4, platelets 157.  BUN 44, creatinine 2.75, potassium 4.7, eGFR 24 mL.  TSH normal.  Total cholesterol 128, triglycerides 99, HDL 36, LDL 72.  A1c 6.5%.  Labs 11/11/2017: Potassium 4.6, serum glucose 124 mg, BUN 33, creatinine 2.0, eGFR 36 mL.  CMP otherwise normal.  A1c 5.9%.  TSH normal.  HB 10.7/HCT 33.7, platelets 143.  Medications and allergies   Allergies  Allergen Reactions  . Codeine Hives    Unknown per pt Unknown per pt Unknown per pt   . Codeine Phosphate     Unknown per pt    Current Outpatient Medications on File Prior to Visit  Medication Sig Dispense Refill  . Ascorbic Acid (VITAMIN C) 1000 MG tablet Take 1,000 mg by mouth daily.    Marland Kitchen atorvastatin (LIPITOR) 40 MG tablet Take 40 mg by  mouth daily.     . Blood Glucose Monitoring Suppl (ONETOUCH VERIO REFLECT) w/Device KIT USE TO CHECK BLOOD SUGAR AS DIRECTED    . Blood Glucose Monitoring Suppl (ONETOUCH VERIO) w/Device KIT by Does not apply route.    Marland Kitchen ELIQUIS 5 MG TABS tablet TAKE 1 TABLET BY MOUTH  TWICE DAILY 180 tablet 3  . FLUoxetine (PROZAC) 20 MG tablet Take 20 mg by mouth every other day.     . furosemide (LASIX) 40 MG tablet Take 0.5 tablets (20 mg total)  by mouth every other day. May take 66m prn edema    . HYDROcodone-acetaminophen (NORCO) 7.5-325 MG tablet Take 1 tablet by mouth every 8 (eight) hours as needed.    . Iron-Vitamin C (IRON 100/C PO) Take 100 mg by mouth daily.    . isosorbide mononitrate (IMDUR) 60 MG 24 hr tablet TAKE 1 TABLET BY MOUTH  DAILY 90 tablet 2  . metoprolol succinate (TOPROL-XL) 50 MG 24 hr tablet Take 0.5 tablets (25 mg total) by mouth daily. Take with or immediately following a meal. 90 tablet 0  . nitroGLYCERIN (NITROSTAT) 0.4 MG SL tablet Place 0.4 mg under the tongue every 5 (five) minutes as needed for chest pain (up to 3 doses max). For chest pain    . omeprazole (PRILOSEC) 20 MG capsule Take 20 mg by mouth daily.    . tamsulosin (FLOMAX) 0.4 MG CAPS capsule Take 1 capsule by mouth at bedtime.    . traMADol (ULTRAM) 50 MG tablet Take 1 tablet (50 mg total) by mouth every 6 (six) hours as needed. 60 tablet 3  . Vitamin D, Ergocalciferol, (DRISDOL) 1.25 MG (50000 UT) CAPS capsule Take 1 capsule by mouth once a week.     No current facility-administered medications on file prior to visit.    Radiology:  No results found.  Cardiac Studies:   Coronary Angiography  505/22/2013 Native LAD and Cx mid occluded. SVG to Circumflex occluded (new), LIMA to LAD, SVG to D1, SVG to RI patent. (CABG 2004- PDahlia Byes MD)  LLeane Callstress test 02/20/2015: 1. The resting electrocardiogram demonstrated atypical atrial flutter, normal resting conduction, nonspecific ST-T changes.  Stress  EKG is non-diagnostic for ischemia as it a pharmacologic stress using Lexiscan. Stress symptoms included dyspnea, dizziness, arm, neck, jaw pain. 2. SPECT images demonstrate homogeneous tracer distribution throughout the myocardium. A very small sized lateral ischemia towards the apex could not be completely excluded. Overall left ventricular systolic function was normal without regional wall motion abnormalities. The left ventricular ejection fraction was 63%.  This is  a low risk stress test. No prior studies to compare.  Zio Patch Remote outpatient cardiac telemetry 14 days 01/14/2020:  Unscheduled (Alert)  01/17/2020: Probably artifact vs >3 Sec pause with underlying AF with slow ventricular response.   01/22/2020: Symptomatic event correlates with 5.4 Sec pause.   EKG EKG 01/14/2020: Atrial fibrillation with controlled ventricular response at the rate of 59 bpm, normal axis.  Incomplete right bundle branch block.  Nonspecific T abnormality.    06/18/2019: Probably normal sinus rhythm at the rate of 52 bpm, normal axis, nonspecific T wave abnormality.  No significant change from EKG 03/27/2018   Assessment     ICD-10-CM   1. Syncope and collapse  R55   2. Paroxysmal atrial fibrillation (HCC)  I48.0   3. 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   There are no discontinued medications.   Recommendations:   [name] is a 79y.o. 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  The patient was seen in clinic one month ago for recurrent episodes of syncope. He was orthostatic at that time. His furosemide and  metoprolol  dosages were both decreased. He has also since underwent a 2-week extended Holter monitor to evaluate for conduction system disease vs cardiac arrhythmias related to underlying coronary disease. The results of his remote cardiac monitoring were reviewed and discussed with the patient today. This showed a > 3 second pause with underlying atrial fibrillation with slow ventricular response as well as a 5.4 second pause correlating with a symptomatic event. He will probably need to have a pacemaker placed. He has been referred to Dr. Rayann Heman for evaluation.   The patient was orthostatic again today in clinic which may be contributing to his dizzy spells as well. He should follow up back in clinic in 6 weeks.  Blair Heys, PA Student 02/09/20 5:02 PM   Patient seen and examined in conjunction with Blair Heys, PA second year student at [employer].  Time spent is in direct patient face to face encounter not including the teaching and training involved. I have personally reached out to Dr. Greggory Brandy regarding EP f/u.   Adrian Prows, MD, [facility] 02/09/2020, [place] PM Office: [phone]

## 2020-02-23 NOTE — Progress Notes (Signed)
Click on the report and you will see attached PDF (2). Thanks

## 2020-03-06 ENCOUNTER — Encounter: Payer: Self-pay | Admitting: Internal Medicine

## 2020-03-06 ENCOUNTER — Ambulatory Visit (INDEPENDENT_AMBULATORY_CARE_PROVIDER_SITE_OTHER): Payer: Medicare Other | Admitting: Internal Medicine

## 2020-03-06 ENCOUNTER — Other Ambulatory Visit: Payer: Self-pay

## 2020-03-06 ENCOUNTER — Encounter: Payer: Self-pay | Admitting: *Deleted

## 2020-03-06 ENCOUNTER — Other Ambulatory Visit: Payer: Self-pay | Admitting: Cardiology

## 2020-03-06 VITALS — BP 132/80 | HR 68 | Ht 67.0 in | Wt 234.4 lb

## 2020-03-06 DIAGNOSIS — I10 Essential (primary) hypertension: Secondary | ICD-10-CM

## 2020-03-06 DIAGNOSIS — R55 Syncope and collapse: Secondary | ICD-10-CM

## 2020-03-06 DIAGNOSIS — R001 Bradycardia, unspecified: Secondary | ICD-10-CM | POA: Diagnosis not present

## 2020-03-06 DIAGNOSIS — I4819 Other persistent atrial fibrillation: Secondary | ICD-10-CM | POA: Diagnosis not present

## 2020-03-06 DIAGNOSIS — I480 Paroxysmal atrial fibrillation: Secondary | ICD-10-CM

## 2020-03-06 DIAGNOSIS — I25118 Atherosclerotic heart disease of native coronary artery with other forms of angina pectoris: Secondary | ICD-10-CM

## 2020-03-06 NOTE — Patient Instructions (Addendum)
Medication Instructions:  Your physician recommends that you continue on your current medications as directed. Please refer to the Current Medication list given to you today.  *If you need a refill on your cardiac medications before your next appointment, please call your pharmacy*  Lab Work: None ordered.  If you have labs (blood work) drawn today and your tests are completely normal, you will receive your results only by: Marland Kitchen MyChart Message (if you have MyChart) OR . A paper copy in the mail If you have any lab test that is abnormal or we need to change your treatment, we will call you to review the results.  Testing/Procedures: None ordered.  Follow-Up: At [facility], you and your health needs are our priority.  As part of our continuing mission to provide you with exceptional heart care, we have created designated Provider Care Teams.  These Care Teams include your primary Cardiologist (physician) and Advanced Practice Providers (APPs -  Physician Assistants and Nurse Practitioners) who all work together to provide you with the care you need, when you need it.  We recommend signing up for the patient portal called "MyChart".  Sign up information is provided on this After Visit Summary.  MyChart is used to connect with patients for Virtual Visits (Telemedicine).  Patients are able to view lab/test results, encounter notes, upcoming appointments, etc.  Non-urgent messages can be sent to your provider as well.   To learn more about what you can do with MyChart, go to [email or web address].      Other Instructions:  Pacemaker Implantation, Adult Pacemaker implantation is a procedure to place a pacemaker inside your chest. A pacemaker is a small computer that sends electrical signals to the heart and helps your heart beat normally. A pacemaker also stores information about your heart rhythms. You may need pacemaker implantation if you:  Have a slow heartbeat (bradycardia).  Faint  (syncope).  Have shortness of breath (dyspnea) due to heart problems. The pacemaker attaches to your heart through a wire, called a lead. Sometimes just one lead is needed. Other times, there will be two leads. There are two types of pacemakers:  Transvenous pacemaker. This type is placed under the skin or muscle of your chest. The lead goes through a vein in the chest area to reach the inside of the heart.  Epicardial pacemaker. This type is placed under the skin or muscle of your chest or belly. The lead goes through your chest to the outside of the heart. Tell a health care provider about:  Any allergies you have.  All medicines you are taking, including vitamins, herbs, eye drops, creams, and over-the-counter medicines.  Any problems you or family members have had with anesthetic medicines.  Any blood or bone disorders you have.  Any surgeries you have had.  Any medical conditions you have.  Whether you are pregnant or may be pregnant. What are the risks? Generally, this is a safe procedure. However, problems may occur, including:  Infection.  Bleeding.  Failure of the pacemaker or the lead.  Collapse of a lung or bleeding into a lung.  Blood clot inside a blood vessel with a lead.  Damage to the heart.  Infection inside the heart (endocarditis).  Allergic reactions to medicines. What happens before the procedure? Staying hydrated Follow instructions from your health care provider about hydration, which may include:  Up to 2 hours before the procedure - you may continue to drink clear liquids, such as water, clear fruit juice, black  coffee, and plain tea. Eating and drinking restrictions Follow instructions from your health care provider about eating and drinking, which may include:  8 hours before the procedure - stop eating heavy meals or foods such as meat, fried foods, or fatty foods.  6 hours before the procedure - stop eating light meals or foods, such as  toast or cereal.  6 hours before the procedure - stop drinking milk or drinks that contain milk.  2 hours before the procedure - stop drinking clear liquids. Medicines  Ask your health care provider about: ? Changing or stopping your regular medicines. This is especially important if you are taking diabetes medicines or blood thinners. ? Taking medicines such as aspirin and ibuprofen. These medicines can thin your blood. Do not take these medicines before your procedure if your health care provider instructs you not to.  You may be given antibiotic medicine to help prevent infection. General instructions  You will have a heart evaluation. This may include an electrocardiogram (ECG), chest X-ray, and heart imaging (echocardiogram,  or echo) tests.  You will have blood tests.  Do not use any products that contain nicotine or tobacco, such as cigarettes and e-cigarettes. If you need help quitting, ask your health care provider.  Plan to have someone take you home from the hospital or clinic.  If you will be going home right after the procedure, plan to have someone with you for 24 hours.  Ask your health care provider how your surgical site will be marked or identified. What happens during the procedure?  To reduce your risk of infection: ? Your health care team will wash or sanitize their hands. ? Your skin will be washed with soap. ? Hair may be removed from the surgical area.  An IV tube will be inserted into one of your veins.  You will be given one or more of the following: ? A medicine to help you relax (sedative). ? A medicine to numb the area (local anesthetic). ? A medicine to make you fall asleep (general anesthetic).  If you are getting a transvenous pacemaker: ? An incision will be made in your upper chest. ? A pocket will be made for the pacemaker. It may be placed under the skin or between layers of muscle. ? The lead will be inserted into a blood vessel that  returns to the heart. ? While X-rays are taken by an imaging machine (fluoroscopy), the lead will be advanced through the vein to the inside of your heart. ? The other end of the lead will be tunneled under the skin and attached to the pacemaker.  If you are getting an epicardial pacemaker: ? An incision will be made near your ribs or breastbone (sternum) for the lead. ? The lead will be attached to the outside of your heart. ? Another incision will be made in your chest or upper belly to create a pocket for the pacemaker. ? The free end of the lead will be tunneled under the skin and attached to the pacemaker.  The transvenous or epicardial pacemaker will be tested. Imaging studies may be done to check the lead position.  The incisions will be closed with stitches (sutures), adhesive strips, or skin glue.  Bandages (dressing) will be placed over the incisions. The procedure may vary among health care providers and hospitals. What happens after the procedure?  Your blood pressure, heart rate, breathing rate, and blood oxygen level will be monitored until the medicines you were given  have worn off.  You will be given antibiotics and pain medicine.  ECG and chest x-rays will be done.  You will wear a continuous type of ECG (Holter monitor) to check your heart rhythm.  Your health care provider will program the pacemaker.  Do not drive for 24 hours if you received a sedative. This information is not intended to replace advice given to you by your health care provider. Make sure you discuss any questions you have with your health care provider. Document Revised: 01/23/2018 Document Reviewed: 10/18/2015 Elsevier Patient Education  [place].

## 2020-03-06 NOTE — Progress Notes (Signed)
Electrophysiology Office Note   Date:  03/06/2020   ID:  [name], DOB 02-15-1941, MRN [id number]  PCP:  Terrilee Croak, PA-C  Cardiologist:  Dr [name] Primary Electrophysiologist: Thompson Grayer, MD    CC: syncope   History of Present Illness: [name] is a 79 y.o. male who presents today for electrophysiology evaluation.   The patient is referred by Dr [name] for EP consultation regarding recurrent syncope. The patient has CRI, stage, IV, HTN, CAD s/p CABG and HTN.  He underwent atrial flutter ablation by me several years ago.  He did well initially.  Over time, he has developed persistent afib.  He has had multiple episode of dizziness and presyncope.  He also reports recurrent symptoms.  He recently was evaluated by Dr [name] and had an event monitor placed which recorded persistent afib (100% burden) with mutiple pauses of > 4 seconds.  These pauses have been daytime and correlate with symptoms. The patient has frequent episodes of fatigue. Today, he denies symptoms of palpitations, chest pain, shortness of breath, orthopnea, PND, lower extremity edema, claudication, dizziness, presyncope, syncope, bleeding, or neurologic sequela. The patient is tolerating medications without difficulties and is otherwise without complaint today.    Past Medical History:  Diagnosis Date  . Chronic kidney disease    Stage IV, nephrologist Dr. Sharyne Richters  . Coronary artery disease    CABG x4 in 2004  . Diabetes ([place])   . Hyperlipidemia   . Hypertension   . Myocardial infarction ([place])   . Obesity   . Obstructive sleep apnea   . Peripheral vascular disease ([place])    Renal artery angioplast 2004   Past Surgical History:  Procedure Laterality Date  . ANTERIOR FUSION LUMBAR SPINE    . COLONOSCOPY  09/21/2018  . CORONARY ARTERY BYPASS GRAFT  06/08/2002  . ELECTROPHYSIOLOGIC STUDY N/A 04/25/2015   Procedure: A-Flutter  Ablation;  Surgeon: Thompson Grayer, MD;  Location: [facility];  Service:  Cardiovascular;  Laterality: N/A;  . ELECTROPHYSIOLOGIC STUDY N/A 06/15/2015   repeat atrial flutter ablation  . EP IMPLANTABLE DEVICE N/A 04/25/2015   Procedure: Loop Recorder Insertion;  Surgeon: Thompson Grayer, MD;  Location: [facility];  Service: Cardiovascular;  Laterality: N/A;  . LAMINECTOMY    . LEFT AND RIGHT HEART CATHETERIZATION WITH CORONARY/GRAFT ANGIOGRAM N/A 10/08/2011   Procedure: LEFT AND RIGHT HEART CATHETERIZATION WITH Beatrix Fetters;  Surgeon: Laverda Page, MD;  Location: [facility] C[facility];  Service: Cardiovascular;  Laterality: N/A;     Current Outpatient Medications  Medication Sig Dispense Refill  . Ascorbic Acid (VITAMIN C) 1000 MG tablet Take 1,000 mg by mouth daily.    Marland Kitchen atorvastatin (LIPITOR) 40 MG tablet Take 40 mg by mouth daily.     . Blood Glucose Monitoring Suppl (ONETOUCH VERIO REFLECT) w/Device KIT USE TO CHECK BLOOD SUGAR AS DIRECTED    . Blood Glucose Monitoring Suppl (ONETOUCH VERIO) w/Device KIT by Does not apply route.    Marland Kitchen ELIQUIS 5 MG TABS tablet TAKE 1 TABLET BY MOUTH  TWICE DAILY 180 tablet 3  . FLUoxetine (PROZAC) 20 MG tablet Take 20 mg by mouth every other day.     . furosemide (LASIX) 40 MG tablet Take 0.5 tablets (20 mg total) by mouth every other day. May take 15m prn edema    . HYDROcodone-acetaminophen (NORCO) 7.5-325 MG tablet Take 1 tablet by mouth every 8 (eight) hours as needed.    . Iron-Vitamin C (IRON  100/C PO) Take 100 mg by mouth daily.    . isosorbide mononitrate (IMDUR) 60 MG 24 hr tablet TAKE 1 TABLET BY MOUTH  DAILY 90 tablet 2  . metoprolol succinate (TOPROL-XL) 50 MG 24 hr tablet Take 0.5 tablets (25 mg total) by mouth daily. Take with or immediately following a meal. 90 tablet 0  . nitroGLYCERIN (NITROSTAT) 0.4 MG SL tablet Place 0.4 mg under the tongue every 5 (five) minutes as needed for chest pain (up to 3 doses max). For chest pain    . omeprazole (PRILOSEC) 20 MG capsule Take 20 mg by mouth daily.    .  tamsulosin (FLOMAX) 0.4 MG CAPS capsule Take 1 capsule by mouth at bedtime.    . traMADol (ULTRAM) 50 MG tablet Take 1 tablet (50 mg total) by mouth every 6 (six) hours as needed. 60 tablet 3  . Vitamin D, Ergocalciferol, (DRISDOL) 1.25 MG (50000 UT) CAPS capsule Take 1 capsule by mouth once a week.     No current facility-administered medications for this visit.    Allergies:   Codeine and Codeine phosphate   Social History:  The patient  reports that he quit smoking about 18 years ago. His smoking use included cigarettes. He quit after 25.00 years of use. He has never used smokeless tobacco. He reports that he does not drink alcohol and does not use drugs.   Family History:  The patient's  family history includes Arthritis in his father and mother; Hypertension in his mother.    ROS:  Please see the history of present illness.   All other systems are personally reviewed and negative.    PHYSICAL EXAM: VS:  BP 132/80   Pulse 68   Ht '5\' 7"'  (1.702 m)   Wt 234 lb 6.4 oz (106.3 kg)   SpO2 96%   BMI 36.71 kg/m  , BMI Body mass index is 36.71 kg/m. GEN: Well nourished, well developed, in no acute distress HEENT: normal Neck: no JVD  Cardiac: iRRR; no murmurs, rubs, or gallops,no edema  Respiratory:   normal work of breathing GI: soft  MS: no deformity or atrophy  EKG:  EKG is ordered today. The ekg ordered today is personally reviewed and shows afib with narrow QRS   Lipid Panel     Component Value Date/Time   CHOL 155 02/27/2010 0823   TRIG 142.0 02/27/2010 0823   HDL 31.90 (L) 02/27/2010 0823   CHOLHDL 5 02/27/2010 0823   VLDL 28.4 02/27/2010 0823   LDLCALC 95 02/27/2010 0823   personally reviewed   Wt Readings from Last 3 Encounters:  03/06/20 234 lb 6.4 oz (106.3 kg)  02/09/20 238 lb (108 kg)  01/14/20 238 lb (108 kg)      Other studies personally reviewed: Additional studies/ records that were reviewed today include: Dr Irven Shelling notes, prior echo/ my prior  notes  Review of the above records today demonstrates: as above  Recent event monitor reveals 100% afib burden with frequent pauses of > 4 seconds (daytime)  ASSESSMENT AND PLAN:  1.  Symptomatic bradycardia with syncope The patient has symptomatic bradycardia with recurrent syncope and pauses of > 4 seconds.  He requires metoprolol for afib with RVR as well as prior CAD.  I would therefore recommend pacemaker implantation at this time.  Risks, benefits, alternatives to pacemaker implantation were discussed in detail with the patient today. The patient understands that the risks include but are not limited to bleeding, infection, pneumothorax, perforation, tamponade, vascular  damage, renal failure, MI, stroke, death,  and lead dislodgement and wishes to proceed. We will therefore schedule the procedure at the next available time.  I have spoken with Dr Lovena Le who will assist with ablation this Thursday. I have also spoken with Dr [name] who will arrange echo tomorrow to evaluate EF prior to the procedure.  2. Persistent afib Continue metoprolol for rate control Continue eliquis for stroke prevention Hold eliquis 24 hours prior to PPM implant Could consider amiodarone for rhythm control post PPM if sinus rhythm is desired. Given renal failure, AADs are otherwise limited Given advanced age, I would not advise ablation as a primary strategy. Ultimately, he may do best with rate control alone Echo to evaluate LA size.  3. CAD s/p CABG No ischemic symptoms Continue metoprolol  4. HTN Stable No change required today   Risks, benefits and potential toxicities for medications prescribed and/or refilled reviewed with patient today.     Army Fossa, MD  03/06/2020 4:38 PM     [address] [place] [address] [phone] (office) [phone] (fax)

## 2020-03-07 ENCOUNTER — Other Ambulatory Visit: Payer: Medicare Other | Admitting: *Deleted

## 2020-03-07 ENCOUNTER — Ambulatory Visit: Payer: Medicare Other

## 2020-03-07 ENCOUNTER — Other Ambulatory Visit (HOSPITAL_COMMUNITY)
Admission: RE | Admit: 2020-03-07 | Discharge: 2020-03-07 | Disposition: A | Discharge status: Home / Self Care | Payer: Medicare Other | Source: Ambulatory Visit | Attending: Internal Medicine | Admitting: Internal Medicine

## 2020-03-07 DIAGNOSIS — I25118 Atherosclerotic heart disease of native coronary artery with other forms of angina pectoris: Secondary | ICD-10-CM

## 2020-03-07 DIAGNOSIS — Z20822 Contact with and (suspected) exposure to covid-19: Secondary | ICD-10-CM | POA: Insufficient documentation

## 2020-03-07 DIAGNOSIS — I480 Paroxysmal atrial fibrillation: Secondary | ICD-10-CM

## 2020-03-07 DIAGNOSIS — Z01812 Encounter for preprocedural laboratory examination: Secondary | ICD-10-CM | POA: Insufficient documentation

## 2020-03-07 LAB — CBC WITH DIFFERENTIAL/PLATELET
Basophils Absolute: 0 10*3/uL (ref 0.0–0.2)
Basos: 0 %
EOS (ABSOLUTE): 0.1 10*3/uL (ref 0.0–0.4)
Eos: 2 %
Hematocrit: 36.5 % — ABNORMAL LOW (ref 37.5–51.0)
Hemoglobin: 12.2 g/dL — ABNORMAL LOW (ref 13.0–17.7)
Lymphocytes Absolute: 1.7 10*3/uL (ref 0.7–3.1)
Lymphs: 24 %
MCH: 30.3 pg (ref 26.6–33.0)
MCHC: 33.4 g/dL (ref 31.5–35.7)
MCV: 91 fL (ref 79–97)
Monocytes Absolute: 0.8 10*3/uL (ref 0.1–0.9)
Monocytes: 12 %
Neutrophils Absolute: 4.4 10*3/uL (ref 1.4–7.0)
Neutrophils: 62 %
Platelets: 126 10*3/uL — ABNORMAL LOW (ref 150–450)
RBC: 4.03 x10E6/uL — ABNORMAL LOW (ref 4.14–5.80)
RDW: 14 % (ref 11.6–15.4)
WBC: 7 10*3/uL (ref 3.4–10.8)

## 2020-03-07 LAB — BASIC METABOLIC PANEL
BUN/Creatinine Ratio: 15 (ref 10–24)
BUN: 30 mg/dL — ABNORMAL HIGH (ref 8–27)
CO2: 25 mmol/L (ref 20–29)
Calcium: 8.5 mg/dL — ABNORMAL LOW (ref 8.6–10.2)
Chloride: 107 mmol/L — ABNORMAL HIGH (ref 96–106)
Creatinine, Ser: 1.98 mg/dL — ABNORMAL HIGH (ref 0.76–1.27)
GFR calc Af Amer: 36 mL/min/{1.73_m2} — ABNORMAL LOW (ref >59)
GFR calc non Af Amer: 31 mL/min/{1.73_m2} — ABNORMAL LOW (ref >59)
Glucose: 142 mg/dL — ABNORMAL HIGH (ref 65–99)
Potassium: 4.5 mmol/L (ref 3.5–5.2)
Sodium: 141 mmol/L (ref 134–144)

## 2020-03-07 LAB — SARS CORONAVIRUS 2 (TAT 6-24 HRS): SARS Coronavirus 2: NEGATIVE

## 2020-03-08 NOTE — Progress Notes (Signed)
Echo for your review, pacemaker tomorrow

## 2020-03-09 ENCOUNTER — Ambulatory Visit (HOSPITAL_COMMUNITY)
Admission: RE | Disposition: A | Discharge status: Home / Self Care | Payer: Medicare Other | Source: Home / Self Care | Attending: Internal Medicine

## 2020-03-09 ENCOUNTER — Other Ambulatory Visit: Payer: Self-pay

## 2020-03-09 ENCOUNTER — Ambulatory Visit (HOSPITAL_COMMUNITY)
Admission: RE | Admit: 2020-03-09 | Discharge: 2020-03-09 | Disposition: A | Discharge status: Home / Self Care | Payer: Medicare Other | Attending: Internal Medicine | Admitting: Internal Medicine

## 2020-03-09 ENCOUNTER — Ambulatory Visit (HOSPITAL_COMMUNITY): Payer: Medicare Other

## 2020-03-09 DIAGNOSIS — Z7901 Long term (current) use of anticoagulants: Secondary | ICD-10-CM | POA: Diagnosis not present

## 2020-03-09 DIAGNOSIS — I129 Hypertensive chronic kidney disease with stage 1 through stage 4 chronic kidney disease, or unspecified chronic kidney disease: Secondary | ICD-10-CM | POA: Insufficient documentation

## 2020-03-09 DIAGNOSIS — E669 Obesity, unspecified: Secondary | ICD-10-CM | POA: Diagnosis not present

## 2020-03-09 DIAGNOSIS — I2510 Atherosclerotic heart disease of native coronary artery without angina pectoris: Secondary | ICD-10-CM | POA: Insufficient documentation

## 2020-03-09 DIAGNOSIS — E1151 Type 2 diabetes mellitus with diabetic peripheral angiopathy without gangrene: Secondary | ICD-10-CM | POA: Diagnosis not present

## 2020-03-09 DIAGNOSIS — E785 Hyperlipidemia, unspecified: Secondary | ICD-10-CM | POA: Diagnosis not present

## 2020-03-09 DIAGNOSIS — R55 Syncope and collapse: Secondary | ICD-10-CM | POA: Diagnosis not present

## 2020-03-09 DIAGNOSIS — Z6836 Body mass index (BMI) 36.0-36.9, adult: Secondary | ICD-10-CM | POA: Diagnosis not present

## 2020-03-09 DIAGNOSIS — G4733 Obstructive sleep apnea (adult) (pediatric): Secondary | ICD-10-CM | POA: Insufficient documentation

## 2020-03-09 DIAGNOSIS — R001 Bradycardia, unspecified: Secondary | ICD-10-CM | POA: Diagnosis not present

## 2020-03-09 DIAGNOSIS — I252 Old myocardial infarction: Secondary | ICD-10-CM | POA: Insufficient documentation

## 2020-03-09 DIAGNOSIS — I4819 Other persistent atrial fibrillation: Secondary | ICD-10-CM | POA: Insufficient documentation

## 2020-03-09 DIAGNOSIS — Z951 Presence of aortocoronary bypass graft: Secondary | ICD-10-CM | POA: Insufficient documentation

## 2020-03-09 DIAGNOSIS — E1122 Type 2 diabetes mellitus with diabetic chronic kidney disease: Secondary | ICD-10-CM | POA: Insufficient documentation

## 2020-03-09 DIAGNOSIS — Z87891 Personal history of nicotine dependence: Secondary | ICD-10-CM | POA: Diagnosis not present

## 2020-03-09 DIAGNOSIS — Z79899 Other long term (current) drug therapy: Secondary | ICD-10-CM | POA: Insufficient documentation

## 2020-03-09 DIAGNOSIS — Z950 Presence of cardiac pacemaker: Secondary | ICD-10-CM

## 2020-03-09 DIAGNOSIS — I442 Atrioventricular block, complete: Secondary | ICD-10-CM

## 2020-03-09 DIAGNOSIS — N184 Chronic kidney disease, stage 4 (severe): Secondary | ICD-10-CM | POA: Insufficient documentation

## 2020-03-09 DIAGNOSIS — I495 Sick sinus syndrome: Secondary | ICD-10-CM | POA: Diagnosis present

## 2020-03-09 HISTORY — PX: PACEMAKER IMPLANT: EP1218

## 2020-03-09 LAB — GLUCOSE, CAPILLARY: Glucose-Capillary: 121 mg/dL — ABNORMAL HIGH (ref 70–99)

## 2020-03-09 SURGERY — PACEMAKER IMPLANT

## 2020-03-09 MED ORDER — FENTANYL CITRATE (PF) 100 MCG/2ML IJ SOLN
INTRAMUSCULAR | Status: AC
  Filled 2020-03-09: qty 2

## 2020-03-09 MED ORDER — FENTANYL CITRATE (PF) 100 MCG/2ML IJ SOLN
INTRAMUSCULAR | Status: DC | PRN
Start: 2020-03-09 — End: 2020-03-09
  Administered 2020-03-09 (×2): 12.5 ug via INTRAVENOUS

## 2020-03-09 MED ORDER — CEFAZOLIN SODIUM-DEXTROSE 2-4 GM/100ML-% IV SOLN
2.0000 g | INTRAVENOUS | Status: AC
  Administered 2020-03-09: 2 g via INTRAVENOUS

## 2020-03-09 MED ORDER — ACETAMINOPHEN 325 MG PO TABS
ORAL_TABLET | ORAL | Status: AC
  Administered 2020-03-09: 650 mg via ORAL
  Filled 2020-03-09: qty 2

## 2020-03-09 MED ORDER — SODIUM CHLORIDE 0.9 % IV SOLN
80.0000 mg | INTRAVENOUS | Status: AC
  Administered 2020-03-09: 80 mg

## 2020-03-09 MED ORDER — MIDAZOLAM HCL 5 MG/5ML IJ SOLN
INTRAMUSCULAR | Status: DC | PRN
  Administered 2020-03-09 (×2): 1 mg via INTRAVENOUS

## 2020-03-09 MED ORDER — SODIUM CHLORIDE 0.9 % IV SOLN
INTRAVENOUS | Status: AC
  Filled 2020-03-09: qty 2

## 2020-03-09 MED ORDER — MIDAZOLAM HCL 5 MG/5ML IJ SOLN
INTRAMUSCULAR | Status: AC
  Filled 2020-03-09: qty 5

## 2020-03-09 MED ORDER — HEPARIN (PORCINE) IN NACL 1000-0.9 UT/500ML-% IV SOLN
INTRAVENOUS | Status: AC
  Filled 2020-03-09: qty 500

## 2020-03-09 MED ORDER — HYDRALAZINE HCL 20 MG/ML IJ SOLN
INTRAMUSCULAR | Status: AC
  Filled 2020-03-09: qty 1

## 2020-03-09 MED ORDER — ACETAMINOPHEN 325 MG PO TABS: 325.0000 mg | ORAL_TABLET | ORAL | Status: DC | PRN

## 2020-03-09 MED ORDER — HEPARIN (PORCINE) IN NACL 1000-0.9 UT/500ML-% IV SOLN
INTRAVENOUS | Status: DC | PRN
  Administered 2020-03-09: 500 mL

## 2020-03-09 MED ORDER — METOPROLOL TARTRATE 5 MG/5ML IV SOLN
INTRAVENOUS | Status: AC
  Filled 2020-03-09: qty 5

## 2020-03-09 MED ORDER — METOPROLOL TARTRATE 5 MG/5ML IV SOLN
INTRAVENOUS | Status: DC | PRN
  Administered 2020-03-09: 5 mg via INTRAVENOUS

## 2020-03-09 MED ORDER — CEFAZOLIN SODIUM-DEXTROSE 1-4 GM/50ML-% IV SOLN
INTRAVENOUS | Status: AC
  Filled 2020-03-09: qty 50

## 2020-03-09 MED ORDER — LIDOCAINE HCL (PF) 1 % IJ SOLN
INTRAMUSCULAR | Status: DC | PRN
  Administered 2020-03-09: 60 mL

## 2020-03-09 MED ORDER — SODIUM CHLORIDE 0.9 % IV SOLN: INTRAVENOUS | Status: DC

## 2020-03-09 MED ORDER — CEFAZOLIN SODIUM-DEXTROSE 2-4 GM/100ML-% IV SOLN
INTRAVENOUS | Status: AC
  Filled 2020-03-09: qty 100

## 2020-03-09 MED ORDER — CEFAZOLIN SODIUM-DEXTROSE 1-4 GM/50ML-% IV SOLN
1.0000 g | Freq: Once | INTRAVENOUS | Status: AC
  Administered 2020-03-09: 1 g via INTRAVENOUS

## 2020-03-09 MED ORDER — CHLORHEXIDINE GLUCONATE 4 % EX LIQD: 4.0000 "application " | Freq: Once | CUTANEOUS | Status: DC

## 2020-03-09 MED ORDER — HYDRALAZINE HCL 20 MG/ML IJ SOLN
INTRAMUSCULAR | Status: DC | PRN
  Administered 2020-03-09: 5 mg via INTRAVENOUS

## 2020-03-09 MED ORDER — LIDOCAINE HCL 1 % IJ SOLN
INTRAMUSCULAR | Status: AC
  Filled 2020-03-09: qty 60

## 2020-03-09 MED ORDER — ONDANSETRON HCL 4 MG/2ML IJ SOLN: 4.0000 mg | Freq: Four times a day (QID) | INTRAMUSCULAR | Status: DC | PRN

## 2020-03-09 SURGICAL SUPPLY — 12 items
CABLE SURGICAL S-101-97-12 (CABLE) ×3 IMPLANT
CATH RIGHTSITE C315HIS02 (CATHETERS) ×3 IMPLANT
IPG PACE AZUR XT DR MRI W1DR01 (Pacemaker) ×1 IMPLANT
LEAD CAPSURE NOVUS 5076-52CM (Lead) ×3 IMPLANT
LEAD SELECT SECURE 3830 383069 (Lead) ×1 IMPLANT
PACE AZURE XT DR MRI W1DR01 (Pacemaker) ×3 IMPLANT
PAD PRO RADIOLUCENT 2001M-C (PAD) ×3 IMPLANT
SELECT SECURE 3830 383069 (Lead) ×3 IMPLANT
SHEATH 7FR PRELUDE SNAP 13 (SHEATH) ×6 IMPLANT
SLITTER 6232ADJ (MISCELLANEOUS) ×3 IMPLANT
TRAY PACEMAKER INSERTION (PACKS) ×3 IMPLANT
WIRE HI TORQ VERSACORE-J 145CM (WIRE) ×3 IMPLANT

## 2020-03-09 NOTE — Progress Notes (Signed)
Patient and son was given discharge instructions. Both verbalized understanding. 

## 2020-03-09 NOTE — Addendum Note (Signed)
Addended by: Rose Phi on: 03/09/2020 11:42 AM   Modules accepted: Orders

## 2020-03-09 NOTE — H&P (Signed)
Electrophysiology Office Note   Date:  03/06/2020   ID:  Jaxten Brosh, DOB [birth year], MRN [id number]  PCP:  Terrilee Croak, PA-C    Cardiologist:  Dr [name] Primary Electrophysiologist: Thompson Grayer, MD       CC: syncope   History of Present Illness: [name] is a 79 y.o. male who presents today for electrophysiology evaluation.   The patient is referred by Dr [name] for EP consultation regarding recurrent syncope. The patient has CRI, stage, IV, HTN, CAD s/p CABG and HTN.  He underwent atrial flutter ablation by me several years ago.  He did well initially.  Over time, he has developed persistent afib.  He has had multiple episode of dizziness and presyncope.  He also reports recurrent symptoms.  He recently was evaluated by Dr [name] and had an event monitor placed which recorded persistent afib (100% burden) with mutiple pauses of > 4 seconds.  These pauses have been daytime and correlate with symptoms. The patient has frequent episodes of fatigue. Today, he denies symptoms of palpitations, chest pain, shortness of breath, orthopnea, PND, lower extremity edema, claudication, dizziness, presyncope, syncope, bleeding, or neurologic sequela. The patient is tolerating medications without difficulties and is otherwise without complaint today.        Past Medical History:  Diagnosis Date  . Chronic kidney disease    Stage IV, nephrologist Dr. Sharyne Richters  . Coronary artery disease    CABG x4 in 2004  . Diabetes ([place])   . Hyperlipidemia   . Hypertension   . Myocardial infarction ([place])   . Obesity   . Obstructive sleep apnea   . Peripheral vascular disease ([place])    Renal artery angioplast 2004        Past Surgical History:  Procedure Laterality Date  . ANTERIOR FUSION LUMBAR SPINE    . COLONOSCOPY  09/21/2018  . CORONARY ARTERY BYPASS GRAFT  06/08/2002  . ELECTROPHYSIOLOGIC STUDY N/A 04/25/2015   Procedure: A-Flutter  Ablation;  Surgeon: Thompson Grayer,  MD;  Location: [facility];  Service: Cardiovascular;  Laterality: N/A;  . ELECTROPHYSIOLOGIC STUDY N/A 06/15/2015   repeat atrial flutter ablation  . EP IMPLANTABLE DEVICE N/A 04/25/2015   Procedure: Loop Recorder Insertion;  Surgeon: Thompson Grayer, MD;  Location: [facility];  Service: Cardiovascular;  Laterality: N/A;  . LAMINECTOMY    . LEFT AND RIGHT HEART CATHETERIZATION WITH CORONARY/GRAFT ANGIOGRAM N/A 10/08/2011   Procedure: LEFT AND RIGHT HEART CATHETERIZATION WITH Beatrix Fetters;  Surgeon: Laverda Page, MD;  Location: [facility];  Service: Cardiovascular;  Laterality: N/A;           Current Outpatient Medications  Medication Sig Dispense Refill  . Ascorbic Acid (VITAMIN C) 1000 MG tablet Take 1,000 mg by mouth daily.    Marland Kitchen atorvastatin (LIPITOR) 40 MG tablet Take 40 mg by mouth daily.     . Blood Glucose Monitoring Suppl (ONETOUCH VERIO REFLECT) w/Device KIT USE TO CHECK BLOOD SUGAR AS DIRECTED    . Blood Glucose Monitoring Suppl (ONETOUCH VERIO) w/Device KIT by Does not apply route.    Marland Kitchen ELIQUIS 5 MG TABS tablet TAKE 1 TABLET BY MOUTH  TWICE DAILY 180 tablet 3  . FLUoxetine (PROZAC) 20 MG tablet Take 20 mg by mouth every other day.     . furosemide (LASIX) 40 MG tablet Take 0.5 tablets (20 mg total) by mouth every other day. May take 6m prn edema    . HYDROcodone-acetaminophen (  NORCO) 7.5-325 MG tablet Take 1 tablet by mouth every 8 (eight) hours as needed.    . Iron-Vitamin C (IRON 100/C PO) Take 100 mg by mouth daily.    . isosorbide mononitrate (IMDUR) 60 MG 24 hr tablet TAKE 1 TABLET BY MOUTH  DAILY 90 tablet 2  . metoprolol succinate (TOPROL-XL) 50 MG 24 hr tablet Take 0.5 tablets (25 mg total) by mouth daily. Take with or immediately following a meal. 90 tablet 0  . nitroGLYCERIN (NITROSTAT) 0.4 MG SL tablet Place 0.4 mg under the tongue every 5 (five) minutes as needed for chest pain (up to 3 doses max). For chest pain     . omeprazole (PRILOSEC) 20 MG capsule Take 20 mg by mouth daily.    . tamsulosin (FLOMAX) 0.4 MG CAPS capsule Take 1 capsule by mouth at bedtime.    . traMADol (ULTRAM) 50 MG tablet Take 1 tablet (50 mg total) by mouth every 6 (six) hours as needed. 60 tablet 3  . Vitamin D, Ergocalciferol, (DRISDOL) 1.25 MG (50000 UT) CAPS capsule Take 1 capsule by mouth once a week.     No current facility-administered medications for this visit.    Allergies:   Codeine and Codeine phosphate   Social History:  The patient  reports that he quit smoking about 18 years ago. His smoking use included cigarettes. He quit after 25.00 years of use. He has never used smokeless tobacco. He reports that he does not drink alcohol and does not use drugs.   Family History:  The patient's  family history includes Arthritis in his father and mother; Hypertension in his mother.    ROS:  Please see the history of present illness.   All other systems are personally reviewed and negative.    PHYSICAL EXAM: VS:  BP 132/80   Pulse 68   Ht _0  (1.702 m)   Wt 234 lb 6.4 oz (106.3 kg)   SpO2 96%   BMI 36.71 kg/m  , BMI Body mass index is 36.71 kg/m. GEN: Well nourished, well developed, in no acute distress HEENT: normal Neck: no JVD  Cardiac: iRRR; no murmurs, rubs, or gallops,no edema  Respiratory:   normal work of breathing GI: soft  MS: no deformity or atrophy  EKG:  EKG is ordered today. The ekg ordered today is personally reviewed and shows afib with narrow QRS   Lipid Panel          Component Value Date/Time   CHOL 155 02/27/2010 0823   TRIG 142.0 02/27/2010 0823   HDL 31.90 (L) 02/27/2010 0823   CHOLHDL 5 02/27/2010 0823   VLDL 28.4 02/27/2010 0823   LDLCALC 95 02/27/2010 0823   personally reviewed      Wt Readings from Last 3 Encounters:  03/06/20 234 lb 6.4 oz (106.3 kg)  02/09/20 238 lb (108 kg)  01/14/20 238 lb (108 kg)      Other studies personally  reviewed: Additional studies/ records that were reviewed today include: Dr Irven Shelling notes, prior echo/ my prior notes  Review of the above records today demonstrates: as above  Recent event monitor reveals 100% afib burden with frequent pauses of > 4 seconds (daytime)  ASSESSMENT AND PLAN:  1.  Symptomatic bradycardia with syncope The patient has symptomatic bradycardia with recurrent syncope and pauses of > 4 seconds.  He requires metoprolol for afib with RVR as well as prior CAD.  I would therefore recommend pacemaker implantation at this time.  Risks, benefits,  alternatives to pacemaker implantation were discussed in detail with the patient today. The patient understands that the risks include but are not limited to bleeding, infection, pneumothorax, perforation, tamponade, vascular damage, renal failure, MI, stroke, death,  and lead dislodgement and wishes to proceed. We will therefore schedule the procedure at the next available time.  I have spoken with Dr Lovena Le who will assist with ablation this Thursday. I have also spoken with Dr [name] who will arrange echo tomorrow to evaluate EF prior to the procedure.  2. Persistent afib Continue metoprolol for rate control Continue eliquis for stroke prevention Hold eliquis 24 hours prior to PPM implant Could consider amiodarone for rhythm control post PPM if sinus rhythm is desired. Given renal failure, AADs are otherwise limited Given advanced age, I would not advise ablation as a primary strategy. Ultimately, he may do best with rate control alone Echo to evaluate LA size.  3. CAD s/p CABG No ischemic symptoms Continue metoprolol  4. HTN Stable No change required today   Risks, benefits and potential toxicities for medications prescribed and/or refilled reviewed with patient today.     Signed, Thompson Grayer, MD  03/06/2020 4:38 PM     EP Attending  Patient seen and examined. Agree with above. The patient presents today  for DDD PM insertion due to symptomatic tachy-brady syndrome. He has had long symptomatic pauses. I have discussed the treatment options including the risk/benefits/goals/expectations of PPM insertion and he wishes to proceed.  Carleene Overlie Sumiye Hirth,MD

## 2020-03-09 NOTE — Discharge Instructions (Signed)
After Your Pacemaker   . You have a Medtronic Pacemaker  ACTIVITY . Do not lift your arm above shoulder height for 1 week after your procedure. After 7 days, you may progress as below.     Thursday March 16, 2020  Friday March 17, 2020 Saturday March 18, 2020 Sunday March 19, 2020   . Do not lift, push, pull, or carry anything over 10 pounds with the affected arm until 6 weeks (Thursday April 20, 2020 ) after your procedure.   . Do NOT DRIVE until you have been seen for your wound check, or as long as instructed by your healthcare provider.   . Ask your healthcare provider when you can go back to work   INCISION/Dressing . If you are on a blood thinner such as Coumadin, Xarelto, Eliquis, Plavix, or Pradaxa please confirm with your provider when this should be resumed. 03/14/20  . Monitor your Pacemaker site for redness, swelling, and drainage. Call the device clinic at [phone] if you experience these symptoms or fever/chills.  . If your incision is sealed with Steri-strips or staples, you may shower 10 days after your procedure or when told by your provider. Do not remove the steri-strips or let the shower hit directly on your site. You may wash around your site with soap and water.    Marland Kitchen Avoid lotions, ointments, or perfumes over your incision until it is well-healed.  . You may use a hot tub or a pool AFTER your wound check appointment if the incision is completely closed.  Marland Kitchen PAcemaker Alerts:  Some alerts are vibratory and others beep. These are NOT emergencies. Please call our office to let us know. If this occurs at night or on weekends, it can wait until the next business day. Send a remote transmission.  . If your device is capable of reading fluid status (for heart failure), you will be offered monthly monitoring to review this with you.   DEVICE MANAGEMENT . Remote monitoring is used to monitor your pacemaker from home. This monitoring is scheduled every 91  days by our office. It allows [place] to keep an eye on the functioning of your device to ensure it is working properly. You will routinely see your Electrophysiologist annually (more often if necessary).   . You should receive your ID card for your new device in 4-8 weeks. Keep this card with you at all times once received. Consider wearing a medical alert bracelet or necklace.  . Your Pacemaker may be MRI compatible. This will be discussed at your next office visit/wound check.  You should avoid contact with strong electric or magnetic fields.    Do not use amateur (ham) radio equipment or electric (arc) welding torches. MP3 player headphones with magnets should not be used. Some devices are safe to use if held at least 12 inches (30 cm) from your Pacemaker. These include power tools, lawn mowers, and speakers. If you are unsure if something is safe to use, ask your health care provider.   When using your cell phone, hold it to the ear that is on the opposite side from the Pacemaker. Do not leave your cell phone in a pocket over the Pacemaker.   You may safely use electric blankets, heating pads, computers, and microwave ovens.  Call the office right away if:  You have chest pain.  You feel more short of breath than you have felt before.  You feel more light-headed than you have felt before.  Your  incision starts to open up.  This information is not intended to replace advice given to you by your health care provider. Make sure you discuss any questions you have with your health care provider.

## 2020-03-10 ENCOUNTER — Encounter (HOSPITAL_COMMUNITY): Payer: Self-pay | Admitting: Internal Medicine

## 2020-03-10 MED FILL — Lidocaine HCl Local Inj 1%: INTRAMUSCULAR | Qty: 20 | Status: AC

## 2020-03-21 ENCOUNTER — Ambulatory Visit: Payer: Medicare Other

## 2020-03-22 ENCOUNTER — Encounter: Payer: Self-pay | Admitting: Cardiology

## 2020-03-22 ENCOUNTER — Other Ambulatory Visit: Payer: Self-pay

## 2020-03-22 ENCOUNTER — Ambulatory Visit: Payer: Medicare Other | Admitting: Cardiology

## 2020-03-22 VITALS — BP 128/67 | HR 66 | Resp 15 | Ht 67.0 in | Wt 233.0 lb

## 2020-03-22 DIAGNOSIS — Z950 Presence of cardiac pacemaker: Secondary | ICD-10-CM

## 2020-03-22 DIAGNOSIS — R0609 Other forms of dyspnea: Secondary | ICD-10-CM

## 2020-03-22 DIAGNOSIS — M170 Bilateral primary osteoarthritis of knee: Secondary | ICD-10-CM

## 2020-03-22 DIAGNOSIS — R0600 Dyspnea, unspecified: Secondary | ICD-10-CM

## 2020-03-22 DIAGNOSIS — I25118 Atherosclerotic heart disease of native coronary artery with other forms of angina pectoris: Secondary | ICD-10-CM

## 2020-03-22 DIAGNOSIS — Z45018 Encounter for adjustment and management of other part of cardiac pacemaker: Secondary | ICD-10-CM

## 2020-03-22 DIAGNOSIS — I442 Atrioventricular block, complete: Secondary | ICD-10-CM

## 2020-03-22 HISTORY — DX: Presence of cardiac pacemaker: Z95.0

## 2020-03-22 HISTORY — DX: Atrioventricular block, complete: I44.2

## 2020-03-22 HISTORY — DX: Encounter for adjustment and management of other part of cardiac pacemaker: Z45.018

## 2020-03-22 MED ORDER — TRAMADOL HCL 50 MG PO TABS: 50.0000 mg | ORAL_TABLET | Freq: Four times a day (QID) | ORAL | 3 refills | Status: DC | PRN

## 2020-03-22 NOTE — Progress Notes (Signed)
Primary Physician/Referring:  Terrilee Croak, PA-C  Patient ID: [name], male    DOB: 1940-12-20, 79 y.o.   MRN: [id number]  Chief Complaint  Patient presents with  . Follow-up    6 weeks  . Syncope and collapse   HPI:    [name]  is a 79 y.o. 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  Patient was seen in our office 01/14/2020 with complaints of recurrent episodes of syncope and dizziness. Patient underwent 2-week extended cardiac monitor which revealed greater than 3-second pause with underlying atrial fibrillation as well as a single 5.4-second pause correlating with a symptomatic event, he was referred to electrophysiology for evaluation of pacemaker implantation. Patient presents for 2-week follow-up following dual-chamber pacemaker implantation by Dr. Cristopher Peru.   Patient is presently doing well.  He denies chest pain, shortness of breath, dizziness, syncope, near syncope, palpitations.  His son is present with the patient.  They both had questions about if patient can start physical therapy as well as questions about wound care.  Past Medical History:  Diagnosis Date  . Chronic kidney disease    Stage IV, nephrologist Dr. Sharyne Richters  . Coronary artery disease    CABG x4 in 2004  . Diabetes ([place])   . Encounter for care of pacemaker 03/22/2020  . Heart block AV complete ([place]) 03/22/2020  . Hyperlipidemia   . Hypertension   . Myocardial infarction ([place])   . Obesity   . Obstructive sleep apnea   . Pacemaker Medtronic dual-chamber  AZURE XT DR MRI 03/09/2020 03/22/2020   Pacemaker Medtronic dual-chamber AZURE XT DR MRI 03/09/2020  . Peripheral vascular disease ([place])    Renal artery angioplast 2004   Past Surgical History:  Procedure Laterality Date  . ANTERIOR FUSION LUMBAR SPINE    . COLONOSCOPY  09/21/2018  . CORONARY ARTERY BYPASS GRAFT  06/08/2002  . ELECTROPHYSIOLOGIC STUDY N/A 04/25/2015   Procedure: A-Flutter  Ablation;  Surgeon: Thompson Grayer, MD;  Location: [place] [facility];  Service: Cardiovascular;  Laterality: N/A;  . ELECTROPHYSIOLOGIC STUDY N/A 06/15/2015   repeat atrial flutter ablation  . EP IMPLANTABLE DEVICE N/A 04/25/2015   Procedure: Loop Recorder Insertion;  Surgeon: Thompson Grayer, MD;  Location: [place] [facility];  Service: Cardiovascular;  Laterality: N/A;  . LAMINECTOMY    . LEFT AND RIGHT HEART CATHETERIZATION WITH CORONARY/GRAFT ANGIOGRAM N/A 10/08/2011   Procedure: LEFT AND RIGHT HEART CATHETERIZATION WITH Beatrix Fetters;  Surgeon: Laverda Page, MD;  Location: [facility] CATH LAB;  Service: Cardiovascular;  Laterality: N/A;  . PACEMAKER IMPLANT N/A 03/09/2020   Procedure: PACEMAKER IMPLANT;  Surgeon: Evans Lance, MD;  Location: [place] [facility];  Service: Cardiovascular;  Laterality: N/A;   Social History   Tobacco Use  . Smoking status: Former Smoker    Years: 25.00    Types: Cigarettes    Quit date: 12/18/2001    Years since quitting: 18.2  . Smokeless tobacco: Never Used  Substance Use Topics  . Alcohol use: No    ROS  Review of Systems  Constitutional: Negative for malaise/fatigue and weight gain.  Cardiovascular: Positive for dyspnea  on exertion (chronic). Negative for chest pain, claudication, leg swelling, near-syncope, orthopnea, palpitations, paroxysmal nocturnal dyspnea and syncope.  Respiratory: Positive for sleep disturbances due to breathing (has OSA and on CPAP). Negative for shortness of breath.   Hematologic/Lymphatic: Does not bruise/bleed easily.    Musculoskeletal: Positive for back pain (chronic). Negative for muscle weakness.  Gastrointestinal: Negative for melena.  Neurological: Negative for dizziness and weakness.  All other systems reviewed and are negative.  Objective  Blood pressure 128/67, pulse 66, resp. rate 15, height '5\' 7"'  (1.702 m), weight 233 lb (105.7 kg), SpO2 97 %.  Vitals with BMI 03/22/2020 03/09/2020 03/09/2020  Height '5\' 7"'  - -  Weight 233 lbs - -  BMI 91.69 - -  Systolic 450 388 828  Diastolic 67 74 74  Pulse 66 70 73    Orthostatic VS for the past 72 hrs (Last 3 readings):  Patient Position BP Location Cuff Size  03/22/20 1559 Sitting Left Arm Large      Physical Exam Constitutional:      Comments: Moderately obese in no distress  Cardiovascular:     Rate and Rhythm: Normal rate and regular rhythm.     Pulses: Intact distal pulses.          Carotid pulses are 2+ on the right side and 2+ on the left side.      Dorsalis pedis pulses are 1+ on the right side and 1+ on the left side.       Posterior tibial pulses are 1+ on the right side and 1+ on the left side.     Heart sounds: Normal heart sounds. No murmur heard.  No gallop.      Comments: No edema bilateral. No JVD. Pulmonary:     Effort: Pulmonary effort is normal.     Breath sounds: Normal breath sounds.  Abdominal:     General: Bowel sounds are normal.     Palpations: Abdomen is soft.  Skin:    Comments: Pacemaker implantation site noted on the left chest.  Steri-Strip still in place, clean dry intact.  Mild ecchymosis and tenderness surrounding the area.    Laboratory examination:   CMP Latest Ref Rng & Units 03/07/2020 06/07/2015 04/25/2015  Glucose 65 - 99 mg/dL 142(H) 180(H) 170(H)  BUN 8 - 27 mg/dL 30(H) 26(H) 20  Creatinine 0.76 - 1.27 mg/dL 1.98(H) 1.82(H) 1.83(H)  Sodium 134 - 144 mmol/L 141 139 142  Potassium 3.5 - 5.2 mmol/L 4.5 4.2 4.1  Chloride 96 - 106 mmol/L 107(H) 103 104  CO2 20 - 29 mmol/L '25 24 29  ' Calcium 8.6 -  10.2 mg/dL 8.5(L) 9.0 8.2(L)  Total Protein 6.0 - 8.3 g/dL - - -  Total Bilirubin 0.3 - 1.2 mg/dL - - -  Alkaline Phos 39 - 117 units/L - - -  AST 0 - 37 units/L - - -  ALT 0 - 53 units/L - - -   CBC Latest Ref Rng & Units 03/07/2020 06/07/2015 04/18/2015  WBC 3.4 - 10.8 x10E3/uL 7.0 6.1 4.9  Hemoglobin 13.0 - 17.7 g/dL 12.2(L) 14.3 12.6(L)  Hematocrit 37.5 - 51.0 % 36.5(L) 42.0 36.7(L)  Platelets 150 - 450 x10E3/uL 126(L) 167 161   Lipid Panel     Component Value Date/Time   CHOL 155 02/27/2010 0823   TRIG 142.0 02/27/2010 0823   HDL 31.90 (L) 02/27/2010 0823   CHOLHDL 5 02/27/2010 0823   VLDL 28.4 02/27/2010 0823   LDLCALC 95 02/27/2010 0034  HEMOGLOBIN A1C No results found for: HGBA1C, MPG TSH No results for input(s): TSH in the last 8760 hours.   External labs :  Labs 09/24/2019: A1c 7.2% Hb 13.2/HCT 38.1, platelets 122, stable on chronic. BUN 30, creatinine 2.49, EGFR 23 mL.  Potassium 4.9.  Serum glucose 137 mg.  CMP normal otherwise. Total cholesterol 139, triglycerides 120, HDL 40, LDL 77.  Labs 10/23/2018: Total cholesterol 128, triglycerides 129, HDL 31, LDL 60.   Labs 06/20/2018: TSH 2.76  Labs 05/31/2018: HB 2.7/60 9.4, platelets 157.  BUN 44, creatinine 2.75, potassium 4.7, eGFR 24 mL.  TSH normal.  Total cholesterol 128, triglycerides 99, HDL 36, LDL 72.  A1c 6.5%.  Labs 11/11/2017: Potassium 4.6, serum glucose 124 mg, BUN 33, creatinine 2.0, eGFR 36 mL.  CMP otherwise normal.  A1c 5.9%.  TSH normal.  HB 10.7/HCT 33.7, platelets 143.  Medications and allergies   Allergies  Allergen Reactions  . Codeine Hives    Unknown per pt    . Codeine Phosphate     Unknown per pt    Current Outpatient Medications on File Prior to Visit  Medication Sig Dispense Refill  . Ascorbic Acid (VITAMIN C) 1000 MG tablet Take 1,000 mg by mouth daily.    Marland Kitchen atorvastatin (LIPITOR) 40 MG tablet Take 40 mg by mouth daily.     . Blood Glucose Monitoring Suppl (ONETOUCH VERIO  REFLECT) w/Device KIT USE TO CHECK BLOOD SUGAR AS DIRECTED    . Blood Glucose Monitoring Suppl (ONETOUCH VERIO) w/Device KIT by Does not apply route.    Marland Kitchen ELIQUIS 5 MG TABS tablet TAKE 1 TABLET BY MOUTH  TWICE DAILY (Patient taking differently: Take 5 mg by mouth 2 (two) times daily. ) 180 tablet 3  . FLUoxetine (PROZAC) 20 MG tablet Take 20 mg by mouth every other day.     . furosemide (LASIX) 40 MG tablet Take 0.5 tablets (20 mg total) by mouth every other day. May take 5m prn edema    . HYDROcodone-acetaminophen (NORCO) 7.5-325 MG tablet Take 1 tablet by mouth every 8 (eight) hours as needed.    . isosorbide mononitrate (IMDUR) 60 MG 24 hr tablet TAKE 1 TABLET BY MOUTH  DAILY 90 tablet 2  . metoprolol succinate (TOPROL-XL) 50 MG 24 hr tablet Take 0.5 tablets (25 mg total) by mouth daily. Take with or immediately following a meal. 90 tablet 0  . nitroGLYCERIN (NITROSTAT) 0.4 MG SL tablet Place 0.4 mg under the tongue every 5 (five) minutes as needed for chest pain (up to 3 doses max). For chest. pain needs refill.    .Marland Kitchenomeprazole (PRILOSEC) 20 MG capsule Take 20 mg by mouth daily.    . tamsulosin (FLOMAX) 0.4 MG CAPS capsule Take 1 capsule by mouth at bedtime.    . Vitamin D, Ergocalciferol, (DRISDOL) 1.25 MG (50000 UT) CAPS capsule Take 1 capsule by mouth once a week.    . Iron-Vitamin C (IRON 100/C PO) Take 100 mg by mouth daily. (Patient not taking: Reported on 03/22/2020)     No current facility-administered medications on file prior to visit.    Radiology:  No results found.  Cardiac Studies:   Coronary Angiography  5May 31, 2013 Native LAD and Cx mid occluded. SVG to Circumflex occluded (new), LIMA to LAD, SVG to D1, SVG to RI patent. (CABG 2004- PDahlia Byes MD)  LLeane Callstress test 02/20/2015: 1. The resting electrocardiogram demonstrated atypical atrial flutter, normal resting conduction, nonspecific ST-T changes.  Stress EKG is  non-diagnostic for ischemia as it a  pharmacologic stress using Lexiscan. Stress symptoms included dyspnea, dizziness, arm, neck, jaw pain. 2. SPECT images demonstrate homogeneous tracer distribution throughout the myocardium. A very small sized lateral ischemia towards the apex could not be completely excluded. Overall left ventricular systolic function was normal without regional wall motion abnormalities. The left ventricular ejection fraction was 63%.  This is  a low risk stress test. No prior studies to compare.  Zio Patch Remote outpatient cardiac telemetry 14 days 01/14/2020:  Unscheduled (Alert)  01/17/2020: Probably artifact vs >3 Sec pause with underlying AF with slow ventricular response.   01/22/2020: Symptomatic event correlates with 5.4 Sec pause.   EKG   EKG 01/14/2020: Atrial fibrillation with controlled ventricular response at the rate of 59 bpm, normal axis.  Incomplete right bundle branch block.  Nonspecific T abnormality.    EKG 06/18/2019: Probably normal sinus rhythm at the rate of 52 bpm, normal axis, nonspecific T wave abnormality.  No significant change from EKG 03/27/2018   Assessment     ICD-10-CM   1. Pacemaker Medtronic dual-chamber AZURE XT DR MRI 03/09/2020  Z95.0   2. Encounter for care of pacemaker  Z45.018   3. Heart block AV complete (HCC)  I44.2   4. Primary osteoarthritis of both knees  M17.0 traMADol (ULTRAM) 50 MG tablet     Meds ordered this encounter  Medications  . traMADol (ULTRAM) 50 MG tablet    Sig: Take 1 tablet (50 mg total) by mouth every 6 (six) hours as needed.    Dispense:  60 tablet    Refill:  3    Medications Discontinued During This Encounter  Medication Reason  . traMADol (ULTRAM) 50 MG tablet Reorder     Recommendations:   Khambrel Amsden  is a 78 y.o. 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  Patient presents for 2-week follow-up after pacemaker implantation.  He is presently doing well, and site appears to be healing well.  Our office has requested pacemaker transmissions be sent to [place], however patient needs to contact Dr. Tanna Furry office and give them permission to release the transmission information to [facility] cardiovascular.  Informed patient and his son about this, they will call Dr. Tanna Furry office and request the release.  I also reviewed recent lipid profile testing, LDL is not at goal.  Patient reports he has not been compliant with healthy diet as he had been previously.  His LDL was previously well controlled, and patient wishes to make diet and lifestyle modifications prior to starting additional lipid management medications.  Encourage patient to make healthy lifestyle changes, will recheck lipids in 3 months and reevaluate need for further management at this time.  Again encouraged patient to focus on weight loss.  Answered patient questions regarding wound care, he expressed understanding.  Patient and son also wish for patient to start doing pulmonary rehab as he has been inactive and deconditioned over the last several months.  Will send a referral now that patient is stable with dual chamber pacemaker in place.    I have refilled his Ultram for degenerative joint disease.    Follow-up  in 3 months for coronary artery disease and complete heart block.  Patient was seen in collaboration with Dr. [name]. He also reviewed patient's chart Dr. [name] is in agreement of the plan.    Alethia Berthold, PA-C 03/22/2020, 6:32 PM Office: [phone]

## 2020-03-23 ENCOUNTER — Telehealth (HOSPITAL_COMMUNITY): Payer: Self-pay | Admitting: *Deleted

## 2020-03-23 NOTE — Telephone Encounter (Signed)
Received referral from [name] with Dr. [name] for this pt to participate in pulmonary rehab with the diagnosis of dyspnea on exertion.  Noted that pt lives in [place].  Called and spoke to pt son [name] regarding preference of location.  Pt does not drive and would have to depend upon family to bring him for his appts. Provided to [name] information about our program days of the week, length of time and general information.  [name] not sure if he could bring his father to [place] for pulmonary rehab.  Advised him that there is a program at [facility]. This is a much closer location.  Called on pt behalf to [facility] to find out their first availability, days of week, length of time.  Attempted to call pt son, [name] back to give him the information.  Unable to leave message- mailbox full. [name], BSN Cardiac and Training and development officer

## 2020-03-24 ENCOUNTER — Telehealth (HOSPITAL_COMMUNITY): Payer: Self-pay | Admitting: *Deleted

## 2020-03-24 NOTE — Telephone Encounter (Signed)
Called and spoke to Zack/DPR regarding information that I found out about the program at [facility] pulmonary rehab.  Based upon information provided, this location would be preferred verses . Called and spoke to the referral coordinator, Rise Paganini at Dr. [name] office.  Made aware of pt preference and the need for new referral that is signed by MD.  Appreciated my call. [name], BSN Cardiac and Training and development officer

## 2020-04-05 ENCOUNTER — Telehealth: Payer: Self-pay

## 2020-04-05 NOTE — Telephone Encounter (Signed)
Spoke with patient's daughter in law. Daughter in law voiced understanding and will pass on the message to patient to follow up with PCP.

## 2020-04-05 NOTE — Telephone Encounter (Signed)
Received a call from patient's son regarding Tramadol refill. Can this medication be refilled? Please advise. Thanks!

## 2020-04-05 NOTE — Telephone Encounter (Signed)
He needs to discuss this with his PCP as they are better equipped to treat degenerative disc disease which is why he is taking it.

## 2020-04-17 ENCOUNTER — Ambulatory Visit: Payer: Medicare Other | Admitting: Cardiology

## 2020-06-08 ENCOUNTER — Telehealth: Payer: Self-pay

## 2020-06-08 NOTE — Telephone Encounter (Signed)
Daughter called requesting samples of Eliquis for pt. She will be picking them up for him tomorrow, 06/09/2020.

## 2020-06-20 ENCOUNTER — Telehealth: Payer: Self-pay | Admitting: Cardiology

## 2020-06-21 ENCOUNTER — Ambulatory Visit (INDEPENDENT_AMBULATORY_CARE_PROVIDER_SITE_OTHER): Payer: Medicare Other | Admitting: Internal Medicine

## 2020-06-21 ENCOUNTER — Encounter: Payer: Self-pay | Admitting: Internal Medicine

## 2020-06-21 ENCOUNTER — Other Ambulatory Visit: Payer: Self-pay

## 2020-06-21 ENCOUNTER — Ambulatory Visit
Admission: RE | Admit: 2020-06-21 | Discharge: 2020-06-21 | Disposition: A | Discharge status: Home / Self Care | Payer: Medicare Other | Source: Ambulatory Visit | Attending: Internal Medicine | Admitting: Internal Medicine

## 2020-06-21 VITALS — BP 136/88 | HR 82 | Ht 67.0 in | Wt 227.2 lb

## 2020-06-21 DIAGNOSIS — Z950 Presence of cardiac pacemaker: Secondary | ICD-10-CM | POA: Diagnosis not present

## 2020-06-21 DIAGNOSIS — I442 Atrioventricular block, complete: Secondary | ICD-10-CM | POA: Diagnosis not present

## 2020-06-21 DIAGNOSIS — T82120A Displacement of cardiac electrode, initial encounter: Secondary | ICD-10-CM | POA: Diagnosis not present

## 2020-06-21 NOTE — Progress Notes (Addendum)
HPI [name] returns today for followup. He is a pleasant 80 yo man with anxiety and HTN and atrial fib. He underwent PPM insertion 3 months ago. He has not had any more near syncopal episodes. He denies chest pain or sob. No edema.  Allergies  Allergen Reactions  . Codeine Hives    Unknown per pt    . Codeine Phosphate     Unknown per pt     Current Outpatient Medications  Medication Sig Dispense Refill  . Ascorbic Acid (VITAMIN C) 1000 MG tablet Take 1,000 mg by mouth daily.    Marland Kitchen atorvastatin (LIPITOR) 40 MG tablet Take 40 mg by mouth daily.     . Blood Glucose Monitoring Suppl (ONETOUCH VERIO REFLECT) w/Device KIT USE TO CHECK BLOOD SUGAR AS DIRECTED    . Blood Glucose Monitoring Suppl (ONETOUCH VERIO) w/Device KIT by Does not apply route.    Marland Kitchen ELIQUIS 5 MG TABS tablet TAKE 1 TABLET BY MOUTH  TWICE DAILY 180 tablet 3  . FLUoxetine (PROZAC) 20 MG tablet Take 20 mg by mouth every other day.     . furosemide (LASIX) 40 MG tablet Take 0.5 tablets (20 mg total) by mouth every other day. May take 28m prn edema    . HYDROcodone-acetaminophen (NORCO) 7.5-325 MG tablet Take 1 tablet by mouth every 8 (eight) hours as needed.    . isosorbide mononitrate (IMDUR) 60 MG 24 hr tablet TAKE 1 TABLET BY MOUTH  DAILY 90 tablet 2  . metoprolol succinate (TOPROL-XL) 50 MG 24 hr tablet Take 0.5 tablets (25 mg total) by mouth daily. Take with or immediately following a meal. 90 tablet 0  . nitroGLYCERIN (NITROSTAT) 0.4 MG SL tablet Place 0.4 mg under the tongue every 5 (five) minutes as needed for chest pain (up to 3 doses max). For chest. pain needs refill.    .Marland Kitchenomeprazole (PRILOSEC) 20 MG capsule Take 20 mg by mouth daily.    . tamsulosin (FLOMAX) 0.4 MG CAPS capsule Take 1 capsule by mouth at bedtime.    . traMADol (ULTRAM) 50 MG tablet Take 1 tablet (50 mg total) by mouth every 6 (six) hours as needed. 60 tablet 3   No current facility-administered medications for this visit.     Past  Medical History:  Diagnosis Date  . Chronic kidney disease    Stage IV, nephrologist Dr. CSharyne Richters . Coronary artery disease    CABG x4 in 2004  . Diabetes ([facility]   . Encounter for care of pacemaker 03/22/2020  . Heart block AV complete ([facility] 03/22/2020  . Hyperlipidemia   . Hypertension   . Myocardial infarction ([facility]   . Obesity   . Obstructive sleep apnea   . Pacemaker Medtronic dual-chamber AZURE XT DR MRI 03/09/2020 03/22/2020   Pacemaker Medtronic dual-chamber AZURE XT DR MRI 03/09/2020  . Peripheral vascular disease ([facility]    Renal artery angioplast 2004    ROS:   All systems reviewed and negative except as noted in the HPI.   Past Surgical History:  Procedure Laterality Date  . ANTERIOR FUSION LUMBAR SPINE    . COLONOSCOPY  09/21/2018  . CORONARY ARTERY BYPASS GRAFT  06/08/2002  . ELECTROPHYSIOLOGIC STUDY N/A 04/25/2015   Procedure: A-Flutter  Ablation;  Surgeon: JThompson Grayer MD;  [facility];  Service: Cardiovascular;  Laterality: N/A;  . ELECTROPHYSIOLOGIC STUDY N/A 06/15/2015   repeat atrial flutter ablation  . EP IMPLANTABLE DEVICE N/A 04/25/2015  Procedure: Loop Recorder Insertion;  Surgeon: Thompson Grayer, MD;  Location: [place] [facility];  Service: Cardiovascular;  Laterality: N/A;  . LAMINECTOMY    . LEFT AND RIGHT HEART CATHETERIZATION WITH CORONARY/GRAFT ANGIOGRAM N/A 10/08/2011   Procedure: LEFT AND RIGHT HEART CATHETERIZATION WITH Beatrix Fetters;  Surgeon: Laverda Page, MD;  [facility];  Service: Cardiovascular;  Laterality: N/A;  . PACEMAKER IMPLANT N/A 03/09/2020   Procedure: PACEMAKER IMPLANT;  Surgeon: Evans Lance, MD;  Location: [place] CV LAB;  Service: Cardiovascular;  Laterality: N/A;     Family History  Problem Relation Age of Onset  . Hypertension Mother   . Arthritis Mother   . Arthritis Father      Social History   Socioeconomic History  . Marital status: Married    Spouse name: Not on  file  . Number of children: 4  . Years of education: Not on file  . Highest education level: Not on file  Occupational History  . Not on file  Tobacco Use  . Smoking status: Former Smoker    Years: 25.00    Types: Cigarettes    Quit date: 12/18/2001    Years since quitting: 18.5  . Smokeless tobacco: Never Used  Vaping Use  . Vaping Use: Never used  Substance and Sexual Activity  . Alcohol use: No  . Drug use: Never  . Sexual activity: Not on file  Other Topics Concern  . Not on file  Social History Narrative  . Not on file   Social Determinants of Health   Financial Resource Strain: Not on file  Food Insecurity: Not on file  Transportation Needs: Not on file  Physical Activity: Not on file  Stress: Not on file  Social Connections: Not on file  Intimate Partner Violence: Not on file     BP 136/88   Pulse 82   Ht '5\' 7"'  (1.702 m)   Wt 227 lb 3.2 oz (103.1 kg)   SpO2 98%   BMI 35.58 kg/m   Physical Exam:  Well appearing NAD HEENT: Unremarkable Neck:  No JVD, no thyromegally Lymphatics:  No adenopathy Back:  No CVA tenderness Lungs:  Clear with no wheezes HEART:  Regular rate rhythm, no murmurs, no rubs, no clicks Abd:  soft, positive bowel sounds, no organomegally, no rebound, no guarding Ext:  2 plus pulses, no edema, no cyanosis, no clubbing Skin:  No rashes no nodules Neuro:  CN II through XII intact, motor grossly intact  EKG - atrial fib with ventricular pacing  DEVICE  Normal device function.  See PaceArt for details. Atrial lead appears to be sensing ventricular activity.  Assess/Plan: 1. CHB - he is asymptomatic, s/p PPM insertion. 2. Atrial fib - his rates appear to be well controlled. 3. Possible atrial lead dislodgement - interrogation today suggests that his atrial lead is in the ventricle. He will be sent for CXR. If we do not try and get him into NSR, he could tolerate a single lead but concerned about the exposed screw and potential for  perforation.  4. HTN - his bp is fairly well controlled. We will follow.  Xavier Overlie Melaine Mcphee,MD  Addendum: His CXR demonstrates that his leads are in stable position. I suspect that he has reverted back to NSR. He was reprogrammed VVIR in Dr. Irven Shelling office. He will need to be reprogrammed DDD at 60/min.

## 2020-06-21 NOTE — Progress Notes (Signed)
e

## 2020-06-21 NOTE — Telephone Encounter (Signed)
Patient be scheduled for in office pacemaker check and reprogramming to DDD mode

## 2020-06-21 NOTE — Patient Instructions (Addendum)
Medication Instructions:  Your physician recommends that you continue on your current medications as directed. Please refer to the Current Medication list given to you today.  Labwork: None ordered.  Testing/Procedures: You are going for a chest X-ray.  Address: [address], [place], [place] [address] Phone: [phone]  Follow-Up: Your physician wants you to follow-up based on results of your chest xray.  Any Other Special Instructions Will Be Listed Below (If Applicable).  If you need a refill on your cardiac medications before your next appointment, please call your pharmacy.

## 2020-06-21 NOTE — Progress Notes (Signed)
Can you please set up pacemaker check in our office. Thanks

## 2020-06-22 ENCOUNTER — Ambulatory Visit: Payer: Medicare Other | Admitting: Cardiology

## 2020-06-22 NOTE — Progress Notes (Signed)
Please schedule patient for in office pacemaker check.

## 2020-06-23 ENCOUNTER — Other Ambulatory Visit: Payer: Self-pay | Admitting: Internal Medicine

## 2020-07-03 ENCOUNTER — Ambulatory Visit: Payer: Medicare Other | Admitting: Cardiology

## 2020-07-03 ENCOUNTER — Encounter: Payer: Self-pay | Admitting: Cardiology

## 2020-07-03 ENCOUNTER — Other Ambulatory Visit: Payer: Self-pay

## 2020-07-03 VITALS — BP 140/80 | HR 86 | Temp 97.7°F | Resp 16 | Ht 67.0 in | Wt 227.0 lb

## 2020-07-03 DIAGNOSIS — Z45018 Encounter for adjustment and management of other part of cardiac pacemaker: Secondary | ICD-10-CM

## 2020-07-03 DIAGNOSIS — Z950 Presence of cardiac pacemaker: Secondary | ICD-10-CM

## 2020-07-03 DIAGNOSIS — I495 Sick sinus syndrome: Secondary | ICD-10-CM

## 2020-07-03 DIAGNOSIS — I442 Atrioventricular block, complete: Secondary | ICD-10-CM

## 2020-07-03 NOTE — Progress Notes (Signed)
ICD-10-CM   1. Encounter for care of pacemaker  Z45.018   2. Pacemaker Medtronic dual-chamber AZURE XT DR MRI 03/09/2020  Z95.0   3. Sick sinus syndrome (HCC)  I49.5   4. Complete AV block (HCC)  I44.2      Scheduled  In office pacemaker check 07/03/20  Single (S)/Dual (D)/BV: D. Presenting VP @ 60/min. Pacemaker dependant:  Not. Underlying ASVS @ 50/min. AP NA%, VP 98%. AMS Episodes 0.  HVR 0. L  Longevity 11.7 Years. Magnet rate: >85%. Lead measurements: Stable.  Histogram: Low (L)/normal (N)/high (H)  Flat. Patient activity Low.   Observations: Normal pacemaker function. Was in VVI mode.. Changes: Switched to DDDR, turned on MVP.    Adrian Prows, MD, [facility] 07/03/2020, 4:17 PM Office: [phone] Pager: [phone] .

## 2020-08-07 ENCOUNTER — Encounter: Payer: Self-pay | Admitting: Cardiology

## 2020-08-07 DIAGNOSIS — Z01818 Encounter for other preprocedural examination: Secondary | ICD-10-CM | POA: Diagnosis not present

## 2020-08-15 DIAGNOSIS — R1310 Dysphagia, unspecified: Secondary | ICD-10-CM | POA: Insufficient documentation

## 2021-01-01 ENCOUNTER — Ambulatory Visit: Payer: Medicare Other | Admitting: Cardiology

## 2021-01-01 ENCOUNTER — Encounter: Payer: Self-pay | Admitting: Cardiology

## 2021-01-01 ENCOUNTER — Other Ambulatory Visit: Payer: Self-pay

## 2021-01-01 VITALS — BP 114/68 | HR 84 | Temp 98.6°F | Resp 16 | Ht 67.0 in | Wt 222.4 lb

## 2021-01-01 DIAGNOSIS — Z950 Presence of cardiac pacemaker: Secondary | ICD-10-CM

## 2021-01-01 DIAGNOSIS — I25118 Atherosclerotic heart disease of native coronary artery with other forms of angina pectoris: Secondary | ICD-10-CM

## 2021-01-01 DIAGNOSIS — I495 Sick sinus syndrome: Secondary | ICD-10-CM

## 2021-01-01 MED ORDER — EZETIMIBE 10 MG PO TABS
10.0000 mg | ORAL_TABLET | Freq: Every day | ORAL | 3 refills | Status: DC
Start: 2021-01-01 — End: 2022-03-22

## 2021-01-01 NOTE — Progress Notes (Signed)
Primary Physician/Referring:  Enid Skeens., MD  Patient ID: [name], male    DOB: 11/19/1940, 80 y.o.   MRN: [id number]  Chief Complaint  Patient presents with   Coronary Artery Disease   PAD   Hypertension   Follow-up    6 months   HPI:    [name]  is a 80 y.o. 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  Patient presents for 38-monthfollow-up.  He has recently had hip replacement, and underwent physical therapy without issue.  He has slowly been increasing physical activity, but remains without formal exercise routine.  Patient is feeling relatively well without specific complaints.  However upon questioning he continues to have dyspnea on exertion.  Denies chest pain, palpitations, syncope, near syncope.  Denies orthopnea, PND.  Patient admits to dietary noncompliance including high salt and fat intake.  Patient's son is present at bedside and contributes to history.  Past Medical History:  Diagnosis Date   Chronic kidney disease    Stage IV, nephrologist Dr. CSharyne Richters  Coronary artery disease    CABG x4 in 2004   Di[facility]    Encounter for care of pacemaker 03/22/2020   Heart block AV complete ([facility] 03/22/2020   Hyperlipidemia    Hypertension    Myocardial infarction ([facility]    Obesity    Obstructive sleep apnea    Pacemaker Medtronic dual-chamber AZURE XT DR MRI 03/09/2020 03/22/2020   Pacemaker Medtronic dual-chamber AZURE XT DR MRI 03/09/2020   Peripheral vascular disease ([facility]    Renal artery angioplast 2004   Past  Surgical History:  Procedure Laterality Date   ANTERIOR FUSION LUMBAR SPINE     COLONOSCOPY  09/21/2018   CORONARY ARTERY BYPASS GRAFT  06/08/2002   ELECTROPHYSIOLOGIC STUDY N/A 04/25/2015   Procedure: A-Flutter  Ablation;  Surgeon: JThompson Grayer MD;  [place] [facility];  Service: Cardiovascular;  Laterality: N/A;   ELECTROPHYSIOLOGIC STUDY N/A 06/15/2015   repeat atrial flutter ablation   EP IMPLANTABLE DEVICE N/A 04/25/2015   Procedure: Loop Recorder Insertion;  Surgeon: JThompson Grayer MD;  [facility];  Service: Cardiovascular;  Laterality: N/A;   LAMINECTOMY     LEFT AND RIGHT HEART CATHETERIZATION WITH CORONARY/GRAFT ANGIOGRAM N/A 10/08/2011   Procedure: LEFT AND RIGHT HEART CATHETERIZATION WITH CBeatrix Fetters  Surgeon: JLaverda Page MD;  [facility];  Service: Cardiovascular;  Laterality: N/A;   PACEMAKER IMPLANT N/A 03/09/2020   Procedure: PACEMAKER IMPLANT;  Surgeon: TEvans Lance MD;  [place];  Service: Cardiovascular;  Laterality: N/A;   Social History   Tobacco Use   Smoking status: Former    Years: 25.00    Types: Cigarettes    Quit date: 12/18/2001    Years since quitting: 19.0   Smokeless tobacco: Never  Substance Use Topics   Alcohol use: No    ROS  Review of Systems  Cardiovascular:  Positive for dyspnea on exertion (chronic). Negative for chest pain, claudication, leg swelling, near-syncope, orthopnea, palpitations, paroxysmal nocturnal dyspnea and syncope.  Respiratory:  Positive for sleep disturbances due to breathing (has  OSA and on CPAP). Negative for shortness of breath.   Musculoskeletal:  Positive for back pain (chronic).  Gastrointestinal:  Negative for melena.  All other systems reviewed and are negative. Objective  Blood pressure 114/68, pulse 84, temperature 98.6 F (37 C), temperature source Temporal, resp. rate 16, height '5\' 7"'  (1.702 m), weight 222 lb 6.4 oz (100.9 kg), SpO2 96  %.  Vitals with BMI 01/01/2021 07/03/2020 06/21/2020  Height '5\' 7"'  '5\' 7"'  '5\' 7"'   Weight 222 lbs 6 oz 227 lbs 227 lbs 3 oz  BMI 34.82 77.11 65.79  Systolic 038 333 832  Diastolic 68 80 88  Pulse 84 86 82    Orthostatic VS for the past 72 hrs (Last 3 readings):  Patient Position BP Location Cuff Size  01/01/21 1507 Sitting Left Arm Large     Physical Exam Vitals reviewed.  Constitutional:      Comments: Moderately obese in no distress  Cardiovascular:     Rate and Rhythm: Normal rate and regular rhythm.     Pulses: Intact distal pulses.          Carotid pulses are 2+ on the right side and 2+ on the left side.      Dorsalis pedis pulses are 1+ on the right side and 1+ on the left side.       Posterior tibial pulses are 1+ on the right side and 1+ on the left side.     Heart sounds: Normal heart sounds, S1 normal and S2 normal. No murmur heard.   No gallop.     Comments: No JVD. Pulmonary:     Effort: Pulmonary effort is normal. No respiratory distress.     Breath sounds: Normal breath sounds. No wheezing, rhonchi or rales.  Musculoskeletal:     Right lower leg: No edema.     Left lower leg: No edema.  Skin:    Comments: Pacemaker implantation site noted on the left chest.   Neurological:     Mental Status: He is alert.   Laboratory examination:   CMP Latest Ref Rng & Units 03/07/2020 06/07/2015 04/25/2015  Glucose 65 - 99 mg/dL 142(H) 180(H) 170(H)  BUN 8 - 27 mg/dL 30(H) 26(H) 20  Creatinine 0.76 - 1.27 mg/dL 1.98(H) 1.82(H) 1.83(H)  Sodium 134 - 144 mmol/L 141 139 142  Potassium 3.5 - 5.2 mmol/L 4.5 4.2 4.1  Chloride 96 - 106 mmol/L 107(H) 103 104  CO2 20 - 29 mmol/L '25 24 29  ' Calcium 8.6 - 10.2 mg/dL 8.5(L) 9.0 8.2(L)  Total Protein 6.0 - 8.3 g/dL - - -  Total Bilirubin 0.3 - 1.2 mg/dL - - -  Alkaline Phos 39 - 117 units/L - - -  AST 0 - 37 units/L - - -  ALT 0 - 53 units/L - - -   CBC Latest Ref Rng & Units 03/07/2020 06/07/2015 04/18/2015  WBC 3.4 - 10.8 x10E3/uL 7.0  6.1 4.9  Hemoglobin 13.0 - 17.7 g/dL 12.2(L) 14.3 12.6(L)  Hematocrit 37.5 - 51.0 % 36.5(L) 42.0 36.7(L)  Platelets 150 - 450 x10E3/uL 126(L) 167 161   Lipid Panel     Component Value Date/Time   CHOL 155 02/27/2010 0823   TRIG 142.0 02/27/2010 0823   HDL 31.90 (L) 02/27/2010 0823   CHOLHDL 5 02/27/2010 0823   VLDL 28.4 02/27/2010 0823   LDLCALC 95 02/27/2010 0823   HEMOGLOBIN A1C No results found for: HGBA1C, MPG TSH No results for input(s): TSH in the last 8760 hours.  External labs : 08/15/2020: Hgb 12.5, HCT 37.5, MCV 91, platelet 129 Glucose 136, BUN 33, creatinine 1.86, GFR 36, sodium 144, potassium 4.7 Total cholesterol 151, triglycerides 85, HDL 41, LDL 94 A1c 6.6%  Labs 09/24/2019: A1c 7.2% Hb 13.2/HCT 38.1, platelets 122, stable on chronic. BUN 30, creatinine 2.49, EGFR 23 mL.  Potassium 4.9.  Serum glucose 137 mg.  CMP normal otherwise. Total cholesterol 139, triglycerides 120, HDL 40, LDL 77.  Labs 10/23/2018: Total cholesterol 128, triglycerides 129, HDL 31, LDL 60.   Labs 06/20/2018: TSH 2.76  Labs 05/31/2018: HB 2.7/60 9.4, platelets 157.  BUN 44, creatinine 2.75, potassium 4.7, eGFR 24 mL.  TSH normal.  Total cholesterol 128, triglycerides 99, HDL 36, LDL 72.  A1c 6.5%.  Labs 11/11/2017: Potassium 4.6, serum glucose 124 mg, BUN 33, creatinine 2.0, eGFR 36 mL.  CMP otherwise normal.  A1c 5.9%.  TSH normal.  HB 10.7/HCT 33.7, platelets 143. Allergies   Allergies  Allergen Reactions   Codeine Hives    Unknown per pt     Codeine Phosphate     Unknown per pt      Medications Prior to Visit:   Outpatient Medications Prior to Visit  Medication Sig Dispense Refill   Ascorbic Acid (VITAMIN C) 1000 MG tablet Take 1,000 mg by mouth daily.     atorvastatin (LIPITOR) 40 MG tablet Take 40 mg by mouth daily.      Blood Glucose Monitoring Suppl (ONETOUCH VERIO REFLECT) w/Device KIT USE TO CHECK BLOOD SUGAR AS DIRECTED     Blood Glucose Monitoring Suppl (ONETOUCH  VERIO) w/Device KIT by Does not apply route.     ELIQUIS 5 MG TABS tablet TAKE 1 TABLET BY MOUTH  TWICE DAILY 180 tablet 3   FLUoxetine (PROZAC) 20 MG tablet Take 20 mg by mouth every other day.      furosemide (LASIX) 40 MG tablet Take 0.5 tablets (20 mg total) by mouth every other day. May take 19m prn edema     HYDROcodone-acetaminophen (NORCO) 7.5-325 MG tablet Take 1 tablet by mouth every 8 (eight) hours as needed.     isosorbide mononitrate (IMDUR) 60 MG 24 hr tablet TAKE 1 TABLET BY MOUTH  DAILY 90 tablet 2   metoprolol succinate (TOPROL-XL) 50 MG 24 hr tablet Take 0.5 tablets (25 mg total) by mouth daily. Take with or immediately following a meal. 90 tablet 0   nitroGLYCERIN (NITROSTAT) 0.4 MG SL tablet Place 0.4 mg under the tongue every 5 (five) minutes as needed for chest pain (up to 3 doses max). For chest. pain needs refill.     omeprazole (PRILOSEC) 20 MG capsule Take 20 mg by mouth daily.     tamsulosin (FLOMAX) 0.4 MG CAPS capsule Take 1 capsule by mouth at bedtime.     traMADol (ULTRAM) 50 MG tablet Take 1 tablet (50 mg total) by mouth every 6 (six) hours as needed. 60 tablet 3   No facility-administered medications prior to visit.     Final Medications at End of Visit    Current Meds  Medication Sig   Ascorbic Acid (VITAMIN C) 1000 MG tablet Take 1,000 mg by mouth daily.   atorvastatin (LIPITOR) 40 MG tablet Take 40 mg by mouth daily.    Blood Glucose Monitoring Suppl (ONETOUCH VERIO REFLECT) w/Device KIT USE TO CHECK BLOOD SUGAR AS DIRECTED   Blood Glucose Monitoring Suppl (ONETOUCH VERIO) w/Device KIT by Does not apply route.   ELIQUIS 5 MG TABS tablet TAKE 1  TABLET BY MOUTH  TWICE DAILY   ezetimibe (ZETIA) 10 MG tablet Take 1 tablet (10 mg total) by mouth daily.   FLUoxetine (PROZAC) 20 MG tablet Take 20 mg by mouth every other day.    furosemide (LASIX) 40 MG tablet Take 0.5 tablets (20 mg total) by mouth every other day. May take 51m prn edema    HYDROcodone-acetaminophen (NORCO) 7.5-325 MG tablet Take 1 tablet by mouth every 8 (eight) hours as needed.   isosorbide mononitrate (IMDUR) 60 MG 24 hr tablet TAKE 1 TABLET BY MOUTH  DAILY   metoprolol succinate (TOPROL-XL) 50 MG 24 hr tablet Take 0.5 tablets (25 mg total) by mouth daily. Take with or immediately following a meal.   nitroGLYCERIN (NITROSTAT) 0.4 MG SL tablet Place 0.4 mg under the tongue every 5 (five) minutes as needed for chest pain (up to 3 doses max). For chest. pain needs refill.   omeprazole (PRILOSEC) 20 MG capsule Take 20 mg by mouth daily.   tamsulosin (FLOMAX) 0.4 MG CAPS capsule Take 1 capsule by mouth at bedtime.   traMADol (ULTRAM) 50 MG tablet Take 1 tablet (50 mg total) by mouth every 6 (six) hours as needed.      Radiology:  No results found.  Cardiac Studies:   Coronary Angiography  506/20/13 Native LAD and Cx mid occluded. SVG to Circumflex occluded (new), LIMA to LAD, SVG to D1, SVG to RI patent. (CABG 2004- PDahlia Byes MD)  LLeane Callstress test 02/20/2015: 1. The resting electrocardiogram demonstrated atypical atrial flutter, normal resting conduction, nonspecific ST-T changes.  Stress EKG is non-diagnostic for ischemia as it a pharmacologic stress using Lexiscan. Stress symptoms included dyspnea, dizziness, arm, neck, jaw pain. 2. SPECT images demonstrate homogeneous tracer distribution throughout the myocardium. A very small sized lateral ischemia towards the apex could not be completely excluded. Overall left ventricular systolic function was normal without regional wall motion abnormalities. The left ventricular ejection fraction was 63%.  This is  a low risk stress test. No prior studies to compare.  Zio Patch Remote outpatient cardiac telemetry 14 days 01/14/2020:  Unscheduled (Alert)  01/17/2020: Probably artifact vs >3 Sec pause with underlying AF with slow ventricular response.   01/22/2020: Symptomatic event correlates with 5.4 Sec  pause.   Pacemaker    Pacemaker Medtronic dual-chamber AZURE XT DR MRI 03/09/2020  Remote pacemaker transmission 09/11/2020: Longevity 12.9 years. Lead impedance and thresholds are normal.  There were 171 mode switches, EGM = atrial fibrillation. Longest 1 hour 9 minutes on 12/26/2020 and brief on 12/27/2020 or 5 seconds. There was 1 high ventricular rate episode, EGM = brief AF/AT. AP 84%, VP 1%. Normal pacemaker function.  Scheduled Remote pacemaker check 06/13/2020: The patient was in persistent atrial fibrillation until 04/27/20. Paroxysmal Atrial Fibrillation. AF burden 51.5%. No VHR episodes. VVI-R mode. Battery longevity is 13.4 years. RV pacing is 82.1 %.  EKG  01/01/2021: Atrial fibrillation with ventricular pacing at a rate of 64 bpm.  EKG 01/14/2020: Atrial fibrillation with controlled ventricular response at the rate of 59 bpm, normal axis.  Incomplete right bundle branch block.  Nonspecific T abnormality.    EKG 06/18/2019: Probably normal sinus rhythm at the rate of 52 bpm, normal axis, nonspecific T wave abnormality.  No significant change from EKG 03/27/2018   Assessment     ICD-10-CM   1. Coronary artery disease of native artery of native heart with stable angina pectoris ([place]  I25.118 EKG 12-Lead    Lipid Panel With  LDL/HDL Ratio    2. Pacemaker Medtronic dual-chamber AZURE XT DR MRI 03/09/2020  Z95.0     3. Sick sinus syndrome (HCC)  I49.5        Meds ordered this encounter  Medications   ezetimibe (ZETIA) 10 MG tablet    Sig: Take 1 tablet (10 mg total) by mouth daily.    Dispense:  90 tablet    Refill:  3    There are no discontinued medications.    Recommendations:   Mylik Pro  is a 80 y.o. 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  Patient presents for follow-up.  He has had no recurrence of syncopal or presyncopal episodes.  He is relatively asymptomatic without clinical evidence of heart failure.  Reviewed and discussed with patient most recent pacemaker transmission, details above.  Initially in the office today patient's blood pressure was soft, however it improved upon recheck.  At last office visit metoprolol had been reduced to 25 mg daily, however patient's son admits that patient may be taking full tablet, I have advised him to verify when they home.  Patient should be taking metoprolol 25 mg daily.  Recommend patient monitor his blood pressure on a regular basis at home and bring a written log with him to his next office visit.  Could consider further reducing metoprolol or isosorbide if necessary if due to hypotension.  With the exception of fatigue and dyspnea on exertion patient is asymptomatic.  Suspect fatigue and dyspnea are multifactorial including deconditioning.  Discussed at length with patient regarding physical activity guidelines, patient appears motivated to increase his physical activity as tolerated.  I personally reviewed external labs, lipids are not at goal.  We will continue statin therapy and add Zetia 10 mg daily.  We will repeat lipid profile testing prior to next office visit.  Follow-up in 6 weeks, sooner if needed, for hyperlipidemia and hypertension.   Alethia Berthold, PA-C 01/01/2021, 4:34 PM Office: [phone]

## 2021-01-17 ENCOUNTER — Telehealth: Payer: Self-pay | Admitting: Student

## 2021-01-17 NOTE — Telephone Encounter (Signed)
Done

## 2021-01-17 NOTE — Telephone Encounter (Signed)
Pt's son called to ask for samples of Eliquis '5mg'$  for his father (pt)

## 2021-02-12 NOTE — Progress Notes (Deleted)
Primary Physician/Referring:  Enid Skeens., MD  Patient ID: [name], male    DOB: 1941/02/25, 80 y.o.   MRN: [id number]  No chief complaint on file.  HPI:    [name]  is a 80 y.o. 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  Patient presents for 6-week follow-up of hyperlipoidemia and hypertension.  Advised patient at last office visit to be sure he was only taking metoprolol 25 mg daily given history of soft blood pressure.  Will assess office visit lipids were uncontrolled, therefore added Zetia 10 mg daily.  Repeat lipid profile testing revealed ***.***  ***  Patient presents for 5-monthfollow-up.  He has recently had hip replacement, and underwent physical therapy without issue.  He has slowly been increasing physical activity, but remains without formal exercise routine.  Patient is feeling relatively well without specific complaints.  However upon questioning he continues to have dyspnea on exertion.  Denies chest pain, palpitations, syncope, near syncope.  Denies orthopnea, PND.  Patient admits to dietary noncompliance including high salt and fat intake.  Patient's son is present at bedside and contributes to history.  Past Medical History:  Diagnosis Date   Chronic kidney disease    Stage IV, nephrologist Dr. CSharyne Richters  Coronary artery disease    CABG x4 in 2004   Diabetes ([facility]    Encounter for care of pacemaker 03/22/2020   Heart block AV complete [place] 03/22/2020   Hyperlipidemia    Hypertension    Myocardial infarction  ([facility]    Obesity    Obstructive sleep apnea    Pacemaker Medtronic dual-chamber AZURE XT DR MRI 03/09/2020 03/22/2020   Pacemaker Medtronic dual-chamber AZURE XT DR MRI 03/09/2020   [place] [facility] [place]    Renal artery angioplast 2004   Family History  Problem Relation Age of Onset   Hypertension Mother    Arthritis Mother    Arthritis Father    Past Surgical History:  Procedure Laterality Date   ANTERIOR FUSION LUMBAR SPINE     COLONOSCOPY  09/21/2018   CORONARY ARTERY BYPASS GRAFT  06/08/2002   ELECTROPHYSIOLOGIC STUDY N/A 04/25/2015   Procedure: A-Flutter  Ablation;  Surgeon: JThompson Grayer MD;  [facility];  Service: Cardiovascular;  Laterality: N/A;   ELECTROPHYSIOLOGIC STUDY N/A 06/15/2015   repeat atrial flutter ablation   EP IMPLANTABLE DEVICE N/A 04/25/2015   Procedure: Loop Recorder Insertion;  Surgeon: JThompson Grayer MD;  [facility];  Service: Cardiovascular;  Laterality: N/A;   LAMINECTOMY     LEFT AND RIGHT HEART CATHETERIZATION WITH CORONARY/GRAFT ANGIOGRAM N/A 10/08/2011   Procedure: LEFT AND RIGHT HEART CATHETERIZATION WITH CBeatrix Fetters  Surgeon: JLaverda Page MD;  [facility];  Service: Cardiovascular;  Laterality: N/A;   PACEMAKER IMPLANT N/A 03/09/2020   Procedure: PACEMAKER IMPLANT;  Surgeon: TEvans Lance MD;  [facility];  Service: Cardiovascular;  Laterality: N/A;   Social History   Tobacco Use   Smoking status: Former    Years: 25.00    Types: Cigarettes    Quit date: 12/18/2001  Years since quitting: 19.1   Smokeless tobacco: Never  Substance Use Topics   Alcohol use: No    ROS  Review of Systems  Cardiovascular:  Positive for dyspnea on exertion (chronic). Negative for chest pain, claudication, leg swelling, near-syncope, orthopnea, palpitations, paroxysmal nocturnal dyspnea and syncope.  Respiratory:  Positive for sleep disturbances due to breathing (has  OSA and on CPAP). Negative for shortness of breath.   Musculoskeletal:  Positive for back pain (chronic).  Gastrointestinal:  Negative for melena.  All other systems reviewed and are negative. Objective  There were no vitals taken for this visit.  Vitals with BMI 01/01/2021 07/03/2020 06/21/2020  Height '5\' 7"'  '5\' 7"'  '5\' 7"'   Weight 222 lbs 6 oz 227 lbs 227 lbs 3 oz  BMI 34.82 65.99 35.70  Systolic 177 939 030  Diastolic 68 80 88  Pulse 84 86 82    No data found.    Physical Exam Vitals reviewed.  Constitutional:      Comments: Moderately obese in no distress  Cardiovascular:     Rate and Rhythm: Normal rate and regular rhythm.     Pulses: Intact distal pulses.          Carotid pulses are 2+ on the right side and 2+ on the left side.      Dorsalis pedis pulses are 1+ on the right side and 1+ on the left side.       Posterior tibial pulses are 1+ on the right side and 1+ on the left side.     Heart sounds: Normal heart sounds, S1 normal and S2 normal. No murmur heard.   No gallop.     Comments: No JVD. Pulmonary:     Effort: Pulmonary effort is normal. No respiratory distress.     Breath sounds: Normal breath sounds. No wheezing, rhonchi or rales.  Musculoskeletal:     Right lower leg: No edema.     Left lower leg: No edema.  Skin:    Comments: Pacemaker implantation site noted on the left chest.   Neurological:     Mental Status: He is alert.   Laboratory examination:   CMP Latest Ref Rng & Units 03/07/2020 06/07/2015 04/25/2015  Glucose 65 - 99 mg/dL 142(H) 180(H) 170(H)  BUN 8 - 27 mg/dL 30(H) 26(H) 20  Creatinine 0.76 - 1.27 mg/dL 1.98(H) 1.82(H) 1.83(H)  Sodium 134 - 144 mmol/L 141 139 142  Potassium 3.5 - 5.2 mmol/L 4.5 4.2 4.1  Chloride 96 - 106 mmol/L 107(H) 103 104  CO2 20 - 29 mmol/L '25 24 29  ' Calcium 8.6 - 10.2 mg/dL 8.5(L) 9.0 8.2(L)  Total Protein 6.0 - 8.3 g/dL - - -  Total Bilirubin 0.3 - 1.2 mg/dL - - -  Alkaline Phos 39 - 117 units/L - - -  AST 0 - 37  units/L - - -  ALT 0 - 53 units/L - - -   CBC Latest Ref Rng & Units 03/07/2020 06/07/2015 04/18/2015  WBC 3.4 - 10.8 x10E3/uL 7.0 6.1 4.9  Hemoglobin 13.0 - 17.7 g/dL 12.2(L) 14.3 12.6(L)  Hematocrit 37.5 - 51.0 % 36.5(L) 42.0 36.7(L)  Platelets 150 - 450 x10E3/uL 126(L) 167 161   Lipid Panel     Component Value Date/Time   CHOL 155 02/27/2010 0823   TRIG 142.0 02/27/2010 0823   HDL 31.90 (L) 02/27/2010 0823   CHOLHDL 5 02/27/2010 0823   VLDL 28.4 02/27/2010 0823   LDLCALC 95 02/27/2010 0823   HEMOGLOBIN A1C No  results found for: HGBA1C, MPG TSH No results for input(s): TSH in the last 8760 hours.   External labs : 08/15/2020: Hgb 12.5, HCT 37.5, MCV 91, platelet 129 Glucose 136, BUN 33, creatinine 1.86, GFR 36, sodium 144, potassium 4.7 Total cholesterol 151, triglycerides 85, HDL 41, LDL 94 A1c 6.6%  Labs 09/24/2019: A1c 7.2% Hb 13.2/HCT 38.1, platelets 122, stable on chronic. BUN 30, creatinine 2.49, EGFR 23 mL.  Potassium 4.9.  Serum glucose 137 mg.  CMP normal otherwise. Total cholesterol 139, triglycerides 120, HDL 40, LDL 77.  Labs 10/23/2018: Total cholesterol 128, triglycerides 129, HDL 31, LDL 60.   Labs 06/20/2018: TSH 2.76  Labs 05/31/2018: HB 2.7/60 9.4, platelets 157.  BUN 44, creatinine 2.75, potassium 4.7, eGFR 24 mL.  TSH normal.  Total cholesterol 128, triglycerides 99, HDL 36, LDL 72.  A1c 6.5%.  Labs 11/11/2017: Potassium 4.6, serum glucose 124 mg, BUN 33, creatinine 2.0, eGFR 36 mL.  CMP otherwise normal.  A1c 5.9%.  TSH normal.  HB 10.7/HCT 33.7, platelets 143.  Allergies   Allergies  Allergen Reactions   Codeine Hives    Unknown per pt     Codeine Phosphate     Unknown per pt    Medications Prior to Visit:   Outpatient Medications Prior to Visit  Medication Sig Dispense Refill   Ascorbic Acid (VITAMIN C) 1000 MG tablet Take 1,000 mg by mouth daily.     atorvastatin (LIPITOR) 40 MG tablet Take 40 mg by mouth daily.      Blood Glucose  Monitoring Suppl (ONETOUCH VERIO REFLECT) w/Device KIT USE TO CHECK BLOOD SUGAR AS DIRECTED     Blood Glucose Monitoring Suppl (ONETOUCH VERIO) w/Device KIT by Does not apply route.     ELIQUIS 5 MG TABS tablet TAKE 1 TABLET BY MOUTH  TWICE DAILY 180 tablet 3   ezetimibe (ZETIA) 10 MG tablet Take 1 tablet (10 mg total) by mouth daily. 90 tablet 3   FLUoxetine (PROZAC) 20 MG tablet Take 20 mg by mouth every other day.      furosemide (LASIX) 40 MG tablet Take 0.5 tablets (20 mg total) by mouth every other day. May take 33m prn edema     HYDROcodone-acetaminophen (NORCO) 7.5-325 MG tablet Take 1 tablet by mouth every 8 (eight) hours as needed.     isosorbide mononitrate (IMDUR) 60 MG 24 hr tablet TAKE 1 TABLET BY MOUTH  DAILY 90 tablet 2   metoprolol succinate (TOPROL-XL) 50 MG 24 hr tablet Take 0.5 tablets (25 mg total) by mouth daily. Take with or immediately following a meal. 90 tablet 0   nitroGLYCERIN (NITROSTAT) 0.4 MG SL tablet Place 0.4 mg under the tongue every 5 (five) minutes as needed for chest pain (up to 3 doses max). For chest. pain needs refill.     omeprazole (PRILOSEC) 20 MG capsule Take 20 mg by mouth daily.     tamsulosin (FLOMAX) 0.4 MG CAPS capsule Take 1 capsule by mouth at bedtime.     traMADol (ULTRAM) 50 MG tablet Take 1 tablet (50 mg total) by mouth every 6 (six) hours as needed. 60 tablet 3   No facility-administered medications prior to visit.   Final Medications at End of Visit    No outpatient medications have been marked as taking for the 02/13/21 encounter (Appointment) with CRayetta Pigg Kalinda Romaniello C, PA-C.   Radiology:  No results found.  Cardiac Studies:   Coronary Angiography  52013/05/28 Native LAD and Cx mid occluded. SVG to  Circumflex occluded (new), LIMA to LAD, SVG to D1, SVG to RI patent. (CABG 2004- Dahlia Byes, MD)  Leane Call stress test 02/20/2015: 1. The resting electrocardiogram demonstrated atypical atrial flutter, normal resting conduction,  nonspecific ST-T changes.  Stress EKG is non-diagnostic for ischemia as it a pharmacologic stress using Lexiscan. Stress symptoms included dyspnea, dizziness, arm, neck, jaw pain. 2. SPECT images demonstrate homogeneous tracer distribution throughout the myocardium. A very small sized lateral ischemia towards the apex could not be completely excluded. Overall left ventricular systolic function was normal without regional wall motion abnormalities. The left ventricular ejection fraction was 63%.  This is  a low risk stress test. No prior studies to compare.  Zio Patch Remote outpatient cardiac telemetry 14 days 01/14/2020:  Unscheduled (Alert)  01/17/2020: Probably artifact vs >3 Sec pause with underlying AF with slow ventricular response.   01/22/2020: Symptomatic event correlates with 5.4 Sec pause.   Pacemaker    Pacemaker Medtronic dual-chamber AZURE XT DR MRI 03/09/2020  Remote pacemaker transmission 01/12/2021: Longevity 13 years. Lead impedance and threshold within normal limits. AP 97%, VP 4%. Therefore AT AF episodes, A. fib burden 2.3%. Normal pacemaker function.  Scheduled Remote pacemaker check 06/13/2020: The patient was in persistent atrial fibrillation until 04/27/20. Paroxysmal Atrial Fibrillation. AF burden 51.5%. No VHR episodes. VVI-R mode. Battery longevity is 13.4 years. RV pacing is 82.1 %.  EKG  01/01/2021: Atrial fibrillation with ventricular pacing at a rate of 64 bpm.  EKG 01/14/2020: Atrial fibrillation with controlled ventricular response at the rate of 59 bpm, normal axis.  Incomplete right bundle branch block.  Nonspecific T abnormality.    EKG 06/18/2019: Probably normal sinus rhythm at the rate of 52 bpm, normal axis, nonspecific T wave abnormality.  No significant change from EKG 03/27/2018   Assessment   No diagnosis found.    No orders of the defined types were placed in this encounter.   There are no discontinued medications.    Recommendations:    [name]  is a 80 y.o. 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  Patient presents for 6-week follow-up of hyperlipoidemia and hypertension.  Advised patient at last office visit to be sure he was only taking metoprolol 25 mg daily given history of soft blood pressure.  Will assess office visit lipids were uncontrolled, therefore added Zetia 10 mg daily.  Repeat lipid profile testing revealed ***.***  ***  Patient presents for follow-up.  He has had no recurrence of syncopal or presyncopal episodes.  He is relatively asymptomatic without clinical evidence of heart failure.  Reviewed and discussed with patient most recent pacemaker transmission, details above.  Initially in the office today patient's blood pressure was soft, however it improved upon recheck.  At last office visit metoprolol had been reduced to 25 mg daily, however patient's son admits that patient may be taking full tablet, I have advised him to verify when they home.  Patient should be taking metoprolol 25 mg daily.  Recommend patient monitor his blood pressure on a regular basis at home and bring a written log with him to his next office visit.  Could consider further  reducing metoprolol or isosorbide if necessary if due to hypotension.  With the exception of fatigue and dyspnea on exertion patient is asymptomatic.  Suspect fatigue and dyspnea are multifactorial including deconditioning.  Discussed at length with patient regarding physical activity guidelines, patient appears motivated to  increase his physical activity as tolerated.  I personally reviewed external labs, lipids are not at goal.  We will continue statin therapy and add Zetia 10 mg daily.  We will repeat lipid profile testing prior to next office visit.  Follow-up in 6 weeks, sooner if needed, for hyperlipidemia and hypertension.   Alethia Berthold, PA-C 02/12/2021, 2:47 PM Office: [phone]

## 2021-02-13 ENCOUNTER — Ambulatory Visit: Payer: Medicare Other | Admitting: Student

## 2021-02-13 DIAGNOSIS — I495 Sick sinus syndrome: Secondary | ICD-10-CM

## 2021-02-13 DIAGNOSIS — E7800 Pure hypercholesterolemia, unspecified: Secondary | ICD-10-CM

## 2021-02-13 DIAGNOSIS — I25118 Atherosclerotic heart disease of native coronary artery with other forms of angina pectoris: Secondary | ICD-10-CM

## 2021-02-13 DIAGNOSIS — I10 Essential (primary) hypertension: Secondary | ICD-10-CM

## 2021-02-13 DIAGNOSIS — Z950 Presence of cardiac pacemaker: Secondary | ICD-10-CM

## 2021-02-21 ENCOUNTER — Ambulatory Visit: Payer: Medicare Other | Admitting: Student

## 2021-02-21 ENCOUNTER — Encounter: Payer: Self-pay | Admitting: Student

## 2021-02-21 ENCOUNTER — Other Ambulatory Visit: Payer: Self-pay

## 2021-02-21 VITALS — BP 123/74 | HR 74 | Ht 67.0 in | Wt 225.0 lb

## 2021-02-21 DIAGNOSIS — I25118 Atherosclerotic heart disease of native coronary artery with other forms of angina pectoris: Secondary | ICD-10-CM

## 2021-02-21 NOTE — Progress Notes (Signed)
Primary Physician/Referring:  Enid Skeens., MD  Patient ID: [name], male    DOB: 1940/10/05, 80 y.o.   MRN: [id number]  Chief Complaint  Patient presents with   Coronary Artery Disease   Follow-up    HPI:    [name]  is a 80 y.o. 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  Patient presents for 6-week follow-up of hyperlipoidemia and hypertension.  Advised patient at last office visit to be sure he was only taking metoprolol 25 mg daily given history of soft blood pressure.  At last office visit lipids were uncontrolled, therefore added Zetia 10 mg daily.  Repeat lipid profile testing unfortunately was not done.  Patient states he is feeling much improved since last visit.  Reports dyspnea on exertion is improving as he is increased his physical activity and is walking more.  Blood pressure remains well controlled without episodes of symptomatic hypotension.  He is tolerating Zetia without issue.  Denies chest pain, palpitations, syncope, near syncope.  Denies orthopnea, PND.  Patient does continue to struggle with diet compliance.  Patient's son is present at bedside and contributes to history.  Past Medical History:  Diagnosis Date   Chronic kidney disease    Stage IV, nephrologist Dr. Sharyne Richters   Coronary artery disease    CABG x4 in 2004   Diabetes [facility])    Encounter for care of pacemaker 03/22/2020   Heart block AV complete ([place]) 03/22/2020   Hyperlipidemia    Hypertension    Myocardial infarction [facility])    Obesity    Obstructive  sleep apnea    Pacemaker Medtronic dual-chamber AZURE XT DR MRI 03/09/2020 03/22/2020   Pacemaker Medtronic dual-chamber AZURE XT DR MRI 03/09/2020   Peripheral vascular disease ([place])    Renal artery angioplast 2004   Family History  Problem Relation Age of Onset   Hypertension Mother    Arthritis Mother    Arthritis Father    Past Surgical History:  Procedure Laterality Date   ANTERIOR FUSION LUMBAR SPINE     COLONOSCOPY  09/21/2018   CORONARY ARTERY BYPASS GRAFT  06/08/2002   ELECTROPHYSIOLOGIC STUDY N/A 04/25/2015   Procedure: A-Flutter  Ablation;  Surgeon: Thompson Grayer, MD;  Location: [place] CV LAB;  Service: Cardiovascular;  Laterality: N/A;   ELECTROPHYSIOLOGIC STUDY N/A 06/15/2015   repeat atrial flutter ablation   EP IMPLANTABLE DEVICE N/A 04/25/2015   Procedure: Loop Recorder Insertion;  Surgeon: Thompson Grayer, MD;  Location: [place] CV LAB;  Service: Cardiovascular;  Laterality: N/A;   LAMINECTOMY     LEFT AND RIGHT HEART CATHETERIZATION WITH CORONARY/GRAFT ANGIOGRAM N/A 10/08/2011   Procedure: LEFT AND RIGHT HEART CATHETERIZATION WITH Beatrix Fetters;  Surgeon: Laverda Page, MD;  [facility] CATH LAB;  Service: Cardiovascular;  Laterality: N/A;   PACEMAKER IMPLANT N/A 03/09/2020   Procedure: PACEMAKER IMPLANT;  Surgeon: Evans Lance, MD;  Location: [place] CV LAB;  Service: Cardiovascular;  Laterality: N/A;   Social History   Tobacco Use   Smoking status: Former    Years: 25.00    Types: Cigarettes    Quit date: 12/18/2001  Years since quitting: 19.1   Smokeless tobacco: Never  Substance Use Topics   Alcohol use: No    ROS  Review of Systems  Cardiovascular:  Positive for dyspnea on exertion (improving). Negative for chest pain, claudication, leg swelling, near-syncope, orthopnea, palpitations, paroxysmal nocturnal dyspnea and syncope.  Respiratory:  Positive for sleep disturbances due to breathing (has OSA and on CPAP). Negative for  shortness of breath.   Musculoskeletal:  Positive for back pain (chronic).  Gastrointestinal:  Negative for melena.  All other systems reviewed and are negative. Objective  Blood pressure 123/74, pulse 74, height '5\' 7"'  (1.702 m), weight 225 lb (102.1 kg), SpO2 96 %.  Vitals with BMI 02/21/2021 01/01/2021 07/03/2020  Height '5\' 7"'  '5\' 7"'  '5\' 7"'   Weight 225 lbs 222 lbs 6 oz 227 lbs  BMI 35.23 73.42 87.68  Systolic 115 726 203  Diastolic 74 68 80  Pulse 74 84 86     Physical Exam Vitals reviewed.  Constitutional:      Comments: Moderately obese in no distress  Cardiovascular:     Rate and Rhythm: Normal rate and regular rhythm.     Pulses: Intact distal pulses.          Carotid pulses are 2+ on the right side and 2+ on the left side.      Dorsalis pedis pulses are 1+ on the right side and 1+ on the left side.       Posterior tibial pulses are 1+ on the right side and 1+ on the left side.     Heart sounds: Normal heart sounds, S1 normal and S2 normal. No murmur heard.   No gallop.     Comments: No JVD. Pulmonary:     Effort: Pulmonary effort is normal. No respiratory distress.     Breath sounds: Normal breath sounds. No wheezing, rhonchi or rales.  Musculoskeletal:     Right lower leg: No edema.     Left lower leg: No edema.  Skin:    Comments: Pacemaker implantation site noted on the left chest.   Neurological:     Mental Status: He is alert.  Physical exam is stable compared to previous.  Laboratory examination:   CMP Latest Ref Rng & Units 03/07/2020 06/07/2015 04/25/2015  Glucose 65 - 99 mg/dL 142(H) 180(H) 170(H)  BUN 8 - 27 mg/dL 30(H) 26(H) 20  Creatinine 0.76 - 1.27 mg/dL 1.98(H) 1.82(H) 1.83(H)  Sodium 134 - 144 mmol/L 141 139 142  Potassium 3.5 - 5.2 mmol/L 4.5 4.2 4.1  Chloride 96 - 106 mmol/L 107(H) 103 104  CO2 20 - 29 mmol/L '25 24 29  ' Calcium 8.6 - 10.2 mg/dL 8.5(L) 9.0 8.2(L)  Total Protein 6.0 - 8.3 g/dL - - -  Total Bilirubin 0.3 - 1.2 mg/dL - - -  Alkaline  Phos 39 - 117 units/L - - -  AST 0 - 37 units/L - - -  ALT 0 - 53 units/L - - -   CBC Latest Ref Rng & Units 03/07/2020 06/07/2015 04/18/2015  WBC 3.4 - 10.8 x10E3/uL 7.0 6.1 4.9  Hemoglobin 13.0 - 17.7 g/dL 12.2(L) 14.3 12.6(L)  Hematocrit 37.5 - 51.0 % 36.5(L) 42.0 36.7(L)  Platelets 150 - 450 x10E3/uL 126(L) 167 161   Lipid Panel     Component Value Date/Time   CHOL 155 02/27/2010 0823   TRIG 142.0 02/27/2010 0823   HDL 31.90 (L) 02/27/2010 0823   CHOLHDL 5 02/27/2010 0823   VLDL 28.4 02/27/2010 5597  [place] 95 02/27/2010 0823   HEMOGLOBIN A1C No results found for: HGBA1C, MPG TSH No results for input(s): TSH in the last 8760 hours.   External labs : 08/15/2020: Hgb 12.5, HCT 37.5, MCV 91, platelet 129 Glucose 136, BUN 33, creatinine 1.86, GFR 36, sodium 144, potassium 4.7 Total cholesterol 151, triglycerides 85, HDL 41, LDL 94 A1c 6.6%  Labs 09/24/2019: A1c 7.2% Hb 13.2/HCT 38.1, platelets 122, stable on chronic. BUN 30, creatinine 2.49, EGFR 23 mL.  Potassium 4.9.  Serum glucose 137 mg.  CMP normal otherwise. Total cholesterol 139, triglycerides 120, HDL 40, LDL 77.  Labs 10/23/2018: Total cholesterol 128, triglycerides 129, HDL 31, LDL 60.   Labs 06/20/2018: TSH 2.76  Labs 05/31/2018: HB 2.7/60 9.4, platelets 157.  BUN 44, creatinine 2.75, potassium 4.7, eGFR 24 mL.  TSH normal.  Total cholesterol 128, triglycerides 99, HDL 36, LDL 72.  A1c 6.5%.  Labs 11/11/2017: Potassium 4.6, serum glucose 124 mg, BUN 33, creatinine 2.0, eGFR 36 mL.  CMP otherwise normal.  A1c 5.9%.  TSH normal.  HB 10.7/HCT 33.7, platelets 143.  Allergies   Allergies  Allergen Reactions   Codeine Hives    Unknown per pt     Codeine Phosphate     Unknown per pt    Medications Prior to Visit:   Outpatient Medications Prior to Visit  Medication Sig Dispense Refill   Ascorbic Acid (VITAMIN C) 1000 MG tablet Take 1,000 mg by mouth daily.     atorvastatin (LIPITOR) 40 MG tablet Take 40 mg  by mouth daily.      Blood Glucose Monitoring Suppl (ONETOUCH VERIO REFLECT) w/Device KIT USE TO CHECK BLOOD SUGAR AS DIRECTED     Blood Glucose Monitoring Suppl (ONETOUCH VERIO) w/Device KIT by Does not apply route.     ELIQUIS 5 MG TABS tablet TAKE 1 TABLET BY MOUTH  TWICE DAILY 180 tablet 3   ezetimibe (ZETIA) 10 MG tablet Take 1 tablet (10 mg total) by mouth daily. 90 tablet 3   FLUoxetine (PROZAC) 20 MG tablet Take 20 mg by mouth every other day.      furosemide (LASIX) 40 MG tablet Take 0.5 tablets (20 mg total) by mouth every other day. May take 72m prn edema     HYDROcodone-acetaminophen (NORCO) 7.5-325 MG tablet Take 1 tablet by mouth every 8 (eight) hours as needed.     isosorbide mononitrate (IMDUR) 60 MG 24 hr tablet TAKE 1 TABLET BY MOUTH  DAILY 90 tablet 2   metoprolol succinate (TOPROL-XL) 50 MG 24 hr tablet Take 0.5 tablets (25 mg total) by mouth daily. Take with or immediately following a meal. 90 tablet 0   nitroGLYCERIN (NITROSTAT) 0.4 MG SL tablet Place 0.4 mg under the tongue every 5 (five) minutes as needed for chest pain (up to 3 doses max). For chest. pain needs refill.     omeprazole (PRILOSEC) 20 MG capsule Take 20 mg by mouth daily.     tamsulosin (FLOMAX) 0.4 MG CAPS capsule Take 1 capsule by mouth at bedtime.     traMADol (ULTRAM) 50 MG tablet Take 1 tablet (50 mg total) by mouth every 6 (six) hours as needed. 60 tablet 3   No facility-administered medications prior to visit.   Final Medications at End of Visit    Current Meds  Medication Sig   Ascorbic Acid (VITAMIN C) 1000 MG tablet Take 1,000 mg by mouth daily.   atorvastatin (LIPITOR) 40 MG tablet Take 40 mg by mouth daily.  Blood Glucose Monitoring Suppl (ONETOUCH VERIO REFLECT) w/Device KIT USE TO CHECK BLOOD SUGAR AS DIRECTED   Blood Glucose Monitoring Suppl (ONETOUCH VERIO) w/Device KIT by Does not apply route.   ELIQUIS 5 MG TABS tablet TAKE 1 TABLET BY MOUTH  TWICE DAILY   ezetimibe (ZETIA) 10 MG  tablet Take 1 tablet (10 mg total) by mouth daily.   FLUoxetine (PROZAC) 20 MG tablet Take 20 mg by mouth every other day.    furosemide (LASIX) 40 MG tablet Take 0.5 tablets (20 mg total) by mouth every other day. May take 26m prn edema   HYDROcodone-acetaminophen (NORCO) 7.5-325 MG tablet Take 1 tablet by mouth every 8 (eight) hours as needed.   isosorbide mononitrate (IMDUR) 60 MG 24 hr tablet TAKE 1 TABLET BY MOUTH  DAILY   metoprolol succinate (TOPROL-XL) 50 MG 24 hr tablet Take 0.5 tablets (25 mg total) by mouth daily. Take with or immediately following a meal.   nitroGLYCERIN (NITROSTAT) 0.4 MG SL tablet Place 0.4 mg under the tongue every 5 (five) minutes as needed for chest pain (up to 3 doses max). For chest. pain needs refill.   omeprazole (PRILOSEC) 20 MG capsule Take 20 mg by mouth daily.   tamsulosin (FLOMAX) 0.4 MG CAPS capsule Take 1 capsule by mouth at bedtime.   traMADol (ULTRAM) 50 MG tablet Take 1 tablet (50 mg total) by mouth every 6 (six) hours as needed.   Radiology:  No results found.  Cardiac Studies:   Coronary Angiography  506-09-2011 Native LAD and Cx mid occluded. SVG to Circumflex occluded (new), LIMA to LAD, SVG to D1, SVG to RI patent. (CABG 2004- PDahlia Byes MD)  LLeane Callstress test 02/20/2015: 1. The resting electrocardiogram demonstrated atypical atrial flutter, normal resting conduction, nonspecific ST-T changes.  Stress EKG is non-diagnostic for ischemia as it a pharmacologic stress using Lexiscan. Stress symptoms included dyspnea, dizziness, arm, neck, jaw pain. 2. SPECT images demonstrate homogeneous tracer distribution throughout the myocardium. A very small sized lateral ischemia towards the apex could not be completely excluded. Overall left ventricular systolic function was normal without regional wall motion abnormalities. The left ventricular ejection fraction was 63%.  This is  a low risk stress test. No prior studies to compare.  Zio  Patch Remote outpatient cardiac telemetry 14 days 01/14/2020:  Unscheduled (Alert)  01/17/2020: Probably artifact vs >3 Sec pause with underlying AF with slow ventricular response.   01/22/2020: Symptomatic event correlates with 5.4 Sec pause.   Pacemaker    Pacemaker Medtronic dual-chamber AZURE XT DR MRI 03/09/2020  Remote pacemaker transmission 01/12/2021: Longevity 13 years. Lead impedance and threshold within normal limits. AP 97%, VP 4%. Therefore AT AF episodes, A. fib burden 2.3%. Normal pacemaker function.  Scheduled Remote pacemaker check 06/13/2020: The patient was in persistent atrial fibrillation until 04/27/20. Paroxysmal Atrial Fibrillation. AF burden 51.5%. No VHR episodes. VVI-R mode. Battery longevity is 13.4 years. RV pacing is 82.1 %.  EKG  01/01/2021: Atrial fibrillation with ventricular pacing at a rate of 64 bpm.  EKG 01/14/2020: Atrial fibrillation with controlled ventricular response at the rate of 59 bpm, normal axis.  Incomplete right bundle branch block.  Nonspecific T abnormality.    EKG 06/18/2019: Probably normal sinus rhythm at the rate of 52 bpm, normal axis, nonspecific T wave abnormality.  No significant change from EKG 03/27/2018   Assessment     ICD-10-CM   1. Coronary artery disease of native artery of native heart with stable angina pectoris (  [place])  I25.118        No orders of the defined types were placed in this encounter.   There are no discontinued medications.    Recommendations:   [name]  is a 80 y.o. 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 S/p Medtronic dual-chamber pacemaker implantation 03/09/2020 for recurrent  syncopal episodes associated with complete heart block.  Patient presents for 6-week follow-up of hyperlipoidemia and hypertension.  Advised patient at last office visit to be sure he was only taking metoprolol 25 mg daily given history of soft blood pressure.  Will assess office visit lipids were uncontrolled, therefore added Zetia 10 mg daily.  Patient is tolerating Zetia without issue.  He has slowly been increasing physical activity which has been improving fatigue as well as dyspnea on exertion.  He has had no further episodes of symptomatic hypotension and blood pressure is well controlled at today's office visit.  Will not make changes to his medications at this time.  Will obtain repeat lipid profile testing prior to next office visit.  Patient is otherwise stable from a cardiovascular standpoint.  Encouraged him to continue with diet and lifestyle modifications as well as weight loss.  Follow-up in 6 months, sooner if needed, for CAD, hypertension, paroxysmal atrial fibrillation.   Alethia Berthold, PA-C 02/21/2021, 5:06 PM Office: [phone]

## 2021-06-06 ENCOUNTER — Other Ambulatory Visit: Payer: Self-pay

## 2021-06-06 DIAGNOSIS — I483 Typical atrial flutter: Secondary | ICD-10-CM

## 2021-06-06 MED ORDER — APIXABAN 5 MG PO TABS: 5.0000 mg | ORAL_TABLET | Freq: Two times a day (BID) | ORAL | 3 refills | Status: DC

## 2021-08-31 ENCOUNTER — Ambulatory Visit: Payer: Medicare Other | Admitting: Student

## 2021-08-31 ENCOUNTER — Encounter: Payer: Self-pay | Admitting: Student

## 2021-08-31 VITALS — BP 134/77 | HR 82 | Temp 98.3°F | Resp 16 | Ht 67.0 in | Wt 227.0 lb

## 2021-08-31 DIAGNOSIS — Z950 Presence of cardiac pacemaker: Secondary | ICD-10-CM

## 2021-08-31 DIAGNOSIS — I480 Paroxysmal atrial fibrillation: Secondary | ICD-10-CM

## 2021-08-31 DIAGNOSIS — I25118 Atherosclerotic heart disease of native coronary artery with other forms of angina pectoris: Secondary | ICD-10-CM

## 2021-08-31 NOTE — Progress Notes (Signed)
? ?Primary Physician/Referring:  Enid Skeens., MD ? ?Patient ID: [name], male    DOB: 1940/07/22, 81 y.o.   MRN: [id number] ? ?Chief Complaint  ?Patient presents with  ? Coronary Artery Disease  ? Follow-up  ? ? ?HPI:   ? ?[name]  is a 81 y.o. 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 ? ?Patient presents for 22-monthfollow-up.  At last office visit he was relatively stable from a cardiovascular standpoint, therefore no changes were made.  I personally reviewed external labs, lipids are well controlled.  Patient remains relatively inactive, however he does daily activities of living without issue. ? ?Denies chest pain, palpitations, syncope, near syncope.  Denies orthopnea, PND.  Patient does continue to struggle with diet compliance. ? ?Patient's son is present at bedside and contributes to history. ? ?Past Medical History:  ?Diagnosis Date  ? Chronic kidney disease   ? Stage IV, nephrologist Dr. CSharyne [name] ? Coronary artery disease   ? CABG x4 in 2004  ? Diabetes ([name]   ? Encounter for care of pacemaker 03/22/2020  ? [place] 03/[place]  ? Hyperlipidemia   ? Hypertension   ? Myocardial infarction ([facility]   ? Obesity   ? Obstructive sleep apnea   ? Pacemaker Medtronic dual-chamber AZURE XT DR MRI 03/09/2020 03/22/2020  ? Pacemaker Medtronic dual-chamber AZURE XT DR MRI 03/09/2020  ? Peripheral vascular disease ([name]   ? Renal artery angioplast 2004  ? ?Family History  ?Problem Relation Age of Onset  ? Hypertension Mother   ? Arthritis Mother   ?  Arthritis Father   ? ?Past Surgical History:  ?Procedure Laterality Date  ? ANTERIOR FUSION LUMBAR SPINE    ? COLONOSCOPY  09/21/2018  ? CORONARY ARTERY BYPASS GRAFT  06/08/2002  ? ELECTROPHYSIOLOGIC STUDY N/A 04/25/2015  ? Procedure: A-Flutter  Ablation;  Surgeon: Xavier Grayer MD;  [facility];  Service: Cardiovascular;  Laterality: N/A;  ? ELECTROPHYSIOLOGIC STUDY N/A 06/15/2015  ? repeat atrial flutter ablation  ? EP IMPLANTABLE DEVICE N/A 04/25/2015  ? Procedure: Loop Recorder Insertion;  Surgeon: Xavier Grayer MD;  [facility];  Service: Cardiovascular;  Laterality: N/A;  ? LAMINECTOMY    ? LEFT AND RIGHT HEART CATHETERIZATION WITH CORONARY/GRAFT ANGIOGRAM N/A 10/08/2011  ? Procedure: LEFT AND RIGHT HEART CATHETERIZATION WITH CBeatrix [name]  Surgeon: Xavier Page MD;  [facility];  Service: Cardiovascular;  Laterality: N/A;  ? PACEMAKER IMPLANT N/A 03/09/2020  ? Procedure: PACEMAKER IMPLANT;  Surgeon: Xavier Lance MD;  [facility];  Service: Cardiovascular;  Laterality: N/A;  ? ?Social History  ? ?Tobacco Use  ? Smoking status: Former  ?  Years: 25.00  ?  Types: Cigarettes  ?  Quit date: 12/18/2001  ?  Years since quitting: 19.7  ? Smokeless tobacco: Never  ?Substance Use Topics  ? Alcohol use: No  ? ? ?ROS  ?Review of Systems  ?Cardiovascular:  Negative for chest pain, claudication, dyspnea on exertion (improved), leg swelling, near-syncope, orthopnea, palpitations, paroxysmal nocturnal dyspnea and syncope.  ?Respiratory:  Positive for sleep  disturbances due to breathing (has OSA and on CPAP). Negative for shortness of breath.   ?Musculoskeletal:  Positive for back pain (chronic).  ?Gastrointestinal:  Negative for melena.  ?All other systems reviewed and are negative. ?Objective  ?Blood pressure 134/77, pulse 82, temperature 98.3 ?F (36.8 ?C), temperature source Temporal, resp. rate 16, height _0  (1.702 m), weight 227 lb (103 kg),  SpO2 97 %.  ? ?  08/31/2021  ? 12:58 PM 02/21/2021  ?  3:16 PM 01/01/2021  ?  3:07 PM  ?Vitals with BMI  ?Height _1  _2  _3   ?Weight 227 lbs 225 lbs 222 lbs 6 oz  ?BMI 35.55 35.23 34.82  ?Systolic 750 518 335  ?Diastolic 77 74 68  ?Pulse 82 74 84  ?  ? Physical Exam ?Vitals reviewed.  ?Constitutional:   ?   Comments: Moderately obese in no distress  ?Cardiovascular:  ?   Rate and Rhythm: Normal rate and regular rhythm.  ?   Pulses: Intact distal pulses.     ?     Carotid pulses are 2+ on the right side and 2+ on the left side. ?     Dorsalis pedis pulses are 1+ on the right side and 1+ on the left side.  ?     Posterior tibial pulses are 1+ on the right side and 1+ on the left side.  ?   Heart sounds: Normal heart sounds, S1 normal and S2 normal. No murmur heard. ?  No gallop.  ?   Comments: No JVD. ?Pulmonary:  ?   Effort: Pulmonary effort is normal. No respiratory distress.  ?   Breath sounds: Normal breath sounds. No wheezing, rhonchi or rales.  ?Musculoskeletal:  ?   Right lower leg: Edema (1+) present.  ?   Left lower leg: Edema (1+) present.  ?Skin: ?   Comments: Pacemaker implantation site noted on the left chest.   ?Neurological:  ?   Mental Status: He is alert.  ? ?Laboratory examination:  ? ? ?  Latest Ref Rng & Units 03/07/2020  ? 10:11 AM 06/07/2015  ?  1:30 PM 04/25/2015  ?  1:25 PM  ?CMP  ?Glucose 65 - 99 mg/dL 142   180   170    ?BUN 8 - 27 mg/dL _4 ?Creatinine 0.76 - 1.27 mg/dL 1.98   1.82   1.83    ?Sodium 134 - 144 mmol/L 141   139   142    ?Potassium 3.5 - 5.2 mmol/L 4.5   4.2   4.1    ?Chloride 96 - 106 mmol/L 107   103   104    ?CO2 20 - 29 mmol/L _5 ?Calcium 8.6 - 10.2 mg/dL 8.5   9.0   8.2    ? ? ?  Latest Ref Rng & Units 03/07/2020  ? 10:11 AM 06/07/2015  ?  1:30 PM 04/18/2015  ?  8:59 AM  ?CBC  ?WBC 3.4 - 10.8 x10E3/uL 7.0   6.1   4.9    ?Hemoglobin 13.0 - 17.7 g/dL 12.2   14.3   12.6    ?Hematocrit 37.5 - 51.0 % 36.5   42.0   36.7    ?Platelets 150 - 450 x10E3/uL  126   167   161    ? ?Lipid Panel  ?   ?Component Value Date/Time  ? CHOL 155  02/27/2010 5093  ? TRIG 142.0 02/27/2010 0823  ? HDL 31.90 (L) 02/27/2010 2671  ? CHOLHDL 5 02/27/2010 0823  ? VLDL 28.4 02/27/2010 0823  ? [place] 95 02/27/2010 0823  ? ?HEMOGLOBIN A1C ?No results found for: HGBA1C, MPG ?TSH ?No results for input(s): TSH in the last 8760 hours. ? ? ?External labs : ?08/15/2020: ?Hgb 12.5, HCT 37.5, MCV 91, platelet 129 ?Glucose 136, BUN 33, creatinine 1.86, GFR 36, sodium 144, potassium 4.7 ?Total cholesterol 151, triglycerides 85, HDL 41, LDL 94 ?A1c 6.6% ? ?Labs 09/24/2019: ?A1c 7.2% ?Hb 13.2/HCT 38.1, platelets 122, stable on chronic. ?BUN 30, creatinine 2.49, EGFR 23 mL.  Potassium 4.9.  Serum glucose 137 mg.  CMP normal otherwise. ?Total cholesterol 139, triglycerides 120, HDL 40, LDL 77. ? ?Labs 10/23/2018: Total cholesterol 128, triglycerides 129, HDL 31, LDL 60.  ? ?Labs 06/20/2018: TSH 2.76 ? ?Labs 05/31/2018: HB 2.7/60 9.4, platelets 157.  BUN 44, creatinine 2.75, potassium 4.7, eGFR 24 mL.  TSH normal.  Total cholesterol 128, triglycerides 99, HDL 36, LDL 72.  A1c 6.5%. ? ?Labs 11/11/2017: Potassium 4.6, serum glucose 124 mg, BUN 33, creatinine 2.0, eGFR 36 mL.  CMP otherwise normal.  A1c 5.9%.  TSH normal.  HB 10.7/HCT 33.7, platelets 143. ? ?Allergies  ? ?Allergies  ?Allergen Reactions  ? Codeine Hives  ?  Unknown per pt ? ?  ? Codeine Phosphate   ?  Unknown per pt  ?  ?Medications Prior to Visit:  ? ?Outpatient Medications Prior to Visit  ?Medication Sig Dispense Refill  ? apixaban (ELIQUIS) 5 MG TABS tablet Take 1 tablet (5 mg total) by mouth 2 (two) times daily. 180 tablet 3  ? Ascorbic Acid (VITAMIN C) 1000 MG tablet Take 1,000 mg by mouth daily.    ? atorvastatin (LIPITOR) 40 MG tablet Take 40 mg by mouth daily.     ? Blood Glucose Monitoring Suppl (ONETOUCH VERIO REFLECT) w/Device KIT USE TO CHECK BLOOD SUGAR AS DIRECTED    ? Blood Glucose Monitoring Suppl (ONETOUCH VERIO) w/Device KIT by Does  not apply route.    ? ezetimibe (ZETIA) 10 MG tablet Take 1 tablet (10 mg total) by mouth daily. 90 tablet 3  ? FLUoxetine (PROZAC) 20 MG capsule Take 20 mg by mouth daily.    ? furosemide (LASIX) 40 MG

## 2021-10-31 ENCOUNTER — Telehealth: Payer: Self-pay

## 2021-10-31 NOTE — Telephone Encounter (Signed)
Pts son called requesting eliquis 5 mg samples for the pt. Samples have been placed upfront. Attempted to call pts sone, no answer. LMAM.

## 2021-10-31 NOTE — Telephone Encounter (Signed)
Amber Pt's son called back and told him samples were up front.

## 2022-03-01 ENCOUNTER — Ambulatory Visit: Payer: Medicare Other | Admitting: Student

## 2022-03-22 ENCOUNTER — Other Ambulatory Visit: Payer: Self-pay

## 2022-03-22 MED ORDER — EZETIMIBE 10 MG PO TABS: 10.0000 mg | ORAL_TABLET | Freq: Every day | ORAL | 3 refills | Status: DC

## 2022-05-30 ENCOUNTER — Ambulatory Visit: Payer: Medicare Other | Admitting: Cardiology

## 2022-05-30 ENCOUNTER — Encounter: Payer: Self-pay | Admitting: Cardiology

## 2022-05-30 VITALS — BP 122/72 | HR 87 | Resp 16 | Ht 67.0 in | Wt 212.0 lb

## 2022-05-30 DIAGNOSIS — I25118 Atherosclerotic heart disease of native coronary artery with other forms of angina pectoris: Secondary | ICD-10-CM

## 2022-05-30 DIAGNOSIS — I5032 Chronic diastolic (congestive) heart failure: Secondary | ICD-10-CM

## 2022-05-30 DIAGNOSIS — I4821 Permanent atrial fibrillation: Secondary | ICD-10-CM

## 2022-05-30 DIAGNOSIS — I442 Atrioventricular block, complete: Secondary | ICD-10-CM

## 2022-05-30 DIAGNOSIS — Z950 Presence of cardiac pacemaker: Secondary | ICD-10-CM

## 2022-05-30 MED ORDER — FUROSEMIDE 40 MG PO TABS: 20.0000 mg | ORAL_TABLET | ORAL | 2 refills | Status: DC

## 2022-05-30 NOTE — Progress Notes (Signed)
Primary Physician/Referring:  Enid Skeens., MD  Patient ID: [name], male    DOB: 1940/10/12, 82 y.o.   MRN: [id number]  Chief Complaint  Patient presents with   Coronary artery disease of native artery of native heart wi   Hyperlipidemia   Follow-up    6 month    HPI:    [name]  is a 82 y.o. 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  This is annual visit.  Denies chest pain, palpitations, syncope, near syncope.  Denies orthopnea, PND.  He has been careful with his diet and has not had any recent worsening leg edema, worsening shortness of breath or recent hospitalization from heart failure.  Patient's son is present at bedside and contributes to history.  Past Medical History:  Diagnosis Date   Chronic kidney disease    Stage IV, nephrologist Dr. Sharyne Richters   Coronary artery disease    CABG x4 in 2004   Diabetes [facility])    Encounter for care of pacemaker 03/22/2020   Heart block AV complete ([place]) 03/22/2020   Hyperlipidemia    Hypertension    Myocardial infarction [facility])    Obesity    Obstructive sleep apnea    Pacemaker Medtronic dual-chamber AZURE XT DR MRI 03/09/2020 03/22/2020   Pacemaker Medtronic dual-chamber AZURE XT DR MRI 03/09/2020   Peripheral vascular disease ([place])    Renal artery angioplast 2004   Family History  Problem Relation Age of Onset   Hypertension Mother    Arthritis Mother    Arthritis Father    Social History   Tobacco Use   Smoking status: Former    Packs/day: 1.50    Years: 25.00    Total pack years: 37.50    Types: Cigarettes    Quit date: 12/18/2001    Years since quitting: 20.4    Smokeless tobacco: Never  Substance Use Topics   Alcohol use: No  Marital Status: Married   ROS  Review of Systems  Cardiovascular:  Negative for chest pain, dyspnea on exertion and leg swelling.   Objective  Blood pressure 122/72, pulse 87, resp. rate 16, height 5\' 7"  (1.702 m), weight 212 lb (96.2 kg), SpO2 94 %.     05/30/2022    3:44 PM 08/31/2021   12:58 PM 02/21/2021    3:16 PM  Vitals with BMI  Height 5\' 7"  5\' 7"  5\' 7"   Weight 212 lbs 227 lbs 225 lbs  BMI 33.2 81.82 99.37  Systolic 169 678 938  Diastolic 72 77 74  Pulse 87 82 74     Physical Exam Vitals reviewed.  Constitutional:      Comments: Moderately obese in no distress  Neck:     Vascular: No carotid bruit or JVD.  Cardiovascular:     Rate and Rhythm: Normal rate and regular rhythm.     Pulses: Normal pulses and intact distal pulses.     Heart sounds: Normal heart sounds, S1 normal and S2 normal.  Pulmonary:     Effort: Pulmonary effort is normal.     Breath sounds: Normal breath sounds.  Abdominal:     General: Bowel sounds are normal.     Palpations: Abdomen is soft.  Musculoskeletal:  Right lower leg: Edema (1-2+ ankle) present.     Left lower leg: Edema (trace) present.  Skin:    Comments: Pacemaker implantation site noted on the left chest.     Laboratory examination:   External labs :  Labs 03/27/2022:  Serum close to 90 mg, BUN 27, creatinine 1.96, EGFR 33 mL, potassium 4.2.  Labs 08/15/2020:  Hb 12.5/HCT 37.5, platelets 129.  Total cholesterol 151, triglycerides 85, HDL 41, LDL 94. Allergies   Allergies  Allergen Reactions   Codeine Hives    Unknown per pt     Codeine Phosphate     Unknown per pt    Final Medications at End of Visit     Current Outpatient Medications:    apixaban (ELIQUIS) 5 MG TABS tablet, Take 1 tablet (5 mg total) by mouth 2 (two) times daily., Disp: 180 tablet, Rfl: 3   atorvastatin (LIPITOR) 40 MG tablet, Take 40 mg by mouth daily. , Disp: , Rfl:     Blood Glucose Monitoring Suppl (ONETOUCH VERIO REFLECT) w/Device KIT, USE TO CHECK BLOOD SUGAR AS DIRECTED, Disp: , Rfl:    Blood Glucose Monitoring Suppl (ONETOUCH VERIO) w/Device KIT, by Does not apply route., Disp: , Rfl:    FLUoxetine (PROZAC) 20 MG capsule, Take 20 mg by mouth daily., Disp: , Rfl:    gabapentin (NEURONTIN) 100 MG capsule, Take 100 mg by mouth 3 (three) times daily., Disp: , Rfl:    HYDROcodone-acetaminophen (NORCO) 10-325 MG tablet, Take 1 tablet by mouth every 8 (eight) hours as needed., Disp: , Rfl:    isosorbide mononitrate (IMDUR) 60 MG 24 hr tablet, TAKE 1 TABLET BY MOUTH  DAILY, Disp: 90 tablet, Rfl: 2   metoprolol succinate (TOPROL-XL) 50 MG 24 hr tablet, Take 0.5 tablets (25 mg total) by mouth daily. Take with or immediately following a meal., Disp: 90 tablet, Rfl: 0   nitroGLYCERIN (NITROSTAT) 0.4 MG SL tablet, Place 0.4 mg under the tongue every 5 (five) minutes as needed for chest pain (up to 3 doses max). For chest. pain needs refill., Disp: , Rfl:    omeprazole (PRILOSEC) 20 MG capsule, Take 20 mg by mouth daily., Disp: , Rfl:    tamsulosin (FLOMAX) 0.4 MG CAPS capsule, Take 1 capsule by mouth at bedtime., Disp: , Rfl:    furosemide (LASIX) 40 MG tablet, Take 0.5 tablets (20 mg total) by mouth every other day. May take 40mg  prn edema, Disp: 90 tablet, Rfl: 2   Radiology:   No results found.  Cardiac Studies:   Coronary Angiography  01-Nov-2011: Native LAD and Cx mid occluded. SVG to Circumflex occluded (new), LIMA to LAD, SVG to D1, SVG to RI patent. (CABG 2004- Dahlia Byes, MD)  Leane Call stress test 02/20/2015: 1. The resting electrocardiogram demonstrated atypical atrial flutter, normal resting conduction, nonspecific ST-T changes.  Stress EKG is non-diagnostic for ischemia as it a pharmacologic stress using Lexiscan. Stress symptoms included dyspnea, dizziness, arm, neck, jaw pain. 2. SPECT images demonstrate homogeneous tracer distribution  throughout the myocardium. A very small sized lateral ischemia towards the apex could not be completely excluded. Overall left ventricular systolic function was normal without regional wall motion abnormalities. The left ventricular ejection fraction was 63%.  This is  a low risk stress test. No prior studies to compare.  PCV ECHOCARDIOGRAM COMPLETE 03/07/2020  Narrative Echocardiogram 03/07/2020: Normal LV systolic function with EF 52%. Left ventricle cavity is normal in size. Normal global wall motion. Indeterminate LAP due to atrial  fibrillation. Calculated EF 52%. Left atrial cavity is mild to moderately dilated at 4.9 cm. Volume index 29. Trileaflet aortic valve with no regurgitation. Mild aortic valve leaflet calcification. Structurally normal tricuspid valve with trace regurgitation. Compared to the study done on 02/21/2015, mild RV dilatation not present, mild TR with moderate pulmonary potential not noted in the present study.     Pacemaker    Pacemaker Medtronic dual-chamber AZURE XT DR MRI 03/09/2020  Remote dual-chamber pacemaker transmission 04/12/2022: AP <0.1%, VP 96%.  Longevity 11 years 5 months.  Lead impedance and thresholds within normal limits.  Patient in sustained atrial fibrillation (100%) since 07/18/2021.  Normal pacemaker function.     EKG   EKG 05/30/2022: Atypical atrial flutter, ventricularly paced rhythm at rate of 79 bpm.  His bundle pacing detected.  No further analysis.  Compared to 08/31/2021, no significant change.  Assessment     ICD-10-CM   1. Coronary artery disease of native artery of native heart with stable angina pectoris (HCC)  I25.118 EKG 12-Lead    Lipid Panel With LDL/HDL Ratio    CMP14+EGFR    CBC    2. Chronic diastolic heart failure (HCC)  I50.32 furosemide (LASIX) 40 MG tablet    Pro b natriuretic peptide (BNP)    3. Permanent atrial fibrillation (HCC)  I48.21     4. Complete AV block (HCC)  I44.2     5. Pacemaker Medtronic  dual-chamber AZURE XT DR MRI 03/09/2020  Z95.0       Meds ordered this encounter  Medications   furosemide (LASIX) 40 MG tablet    Sig: Take 0.5 tablets (20 mg total) by mouth every other day. May take 40mg  prn edema    Dispense:  90 tablet    Refill:  2    Medications Discontinued During This Encounter  Medication Reason   Ascorbic Acid (VITAMIN C) 1000 MG tablet Patient Preference   ezetimibe (ZETIA) 10 MG tablet Patient Preference   metoprolol tartrate (LOPRESSOR) 50 MG tablet Duplicate   traMADol (ULTRAM) 50 MG tablet Patient Preference   furosemide (LASIX) 40 MG tablet Reorder   Recommendations:   Kailer Heindel  is a 82 y.o. 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   1. Coronary artery disease of native artery of native heart with stable angina pectoris ([place]) This is annual visit, he has been very careful with his diet and has lost weight and since then he has not had any acute exacerbation of heart failure.  He has not had any angina pectoris as well and he has not used any sublingual nitroglycerin.  Continue present medications, no change in the EKG.  2. Chronic diastolic heart failure (HCC) There is trace right leg edema at the vein harvest site from prior CABG, otherwise no clinical evidence of heart failure, no JVD, no third heart sound.  He is presently doing well and watching his diet very carefully.  I refilled his furosemide.  He also needs furosemide in view of stage III-IV chronic kidney disease being followed by nephrology.  3. Permanent atrial fibrillation [facility]) Patient is in persistent atypical atrial flutter.  But he is  completely asymptomatic without acute decompensated heart failure, he is pacemaker dependent and ventricular  rate is appropriate.  Hence no changes in the medications were done today.  He is on a low-dose of a beta-blocker, continue the same.  In view of elevated chads vascular score, continue anticoagulation with Eliquis.  4. Complete AV block ([place]) He is pacemaker dependent, pacemaker is functioning normally.  Has underlying complete heart block.  5. Pacemaker Medtronic dual-chamber AZURE XT DR MRI 03/09/2020 I reviewed the results of the recently transmitted pacemaker from November 2023, normal function, we discussed regarding persistent atrial fibrillation/flutter, he is appropriately on anticoagulation, continue the same.  No changes in the medications were done today.  Patient needs lipid profile testing, CBC, CMP and also NT proBNP as a baseline.  Otherwise stable from cardiac standpoint, I will see him back in a year or sooner if problems.     Adrian Prows, PA-C 05/30/2022, 4:14 PM Office: [phone]

## 2022-05-31 ENCOUNTER — Other Ambulatory Visit: Payer: Self-pay

## 2022-05-31 DIAGNOSIS — I5032 Chronic diastolic (congestive) heart failure: Secondary | ICD-10-CM

## 2022-06-06 LAB — CMP14+EGFR
ALT: 15 IU/L (ref 0–44)
AST: 20 IU/L (ref 0–40)
Albumin/Globulin Ratio: 1.8 (ref 1.2–2.2)
Albumin: 3.7 g/dL (ref 3.7–4.7)
Alkaline Phosphatase: 123 IU/L — ABNORMAL HIGH (ref 44–121)
BUN/Creatinine Ratio: 11 (ref 10–24)
BUN: 22 mg/dL (ref 8–27)
Bilirubin Total: 0.7 mg/dL (ref 0.0–1.2)
CO2: 26 mmol/L (ref 20–29)
Calcium: 8.1 mg/dL — ABNORMAL LOW (ref 8.6–10.2)
Chloride: 106 mmol/L (ref 96–106)
Creatinine, Ser: 2.01 mg/dL — ABNORMAL HIGH (ref 0.76–1.27)
Globulin, Total: 2.1 g/dL (ref 1.5–4.5)
Glucose: 123 mg/dL — ABNORMAL HIGH (ref 70–99)
Potassium: 5.1 mmol/L (ref 3.5–5.2)
Sodium: 142 mmol/L (ref 134–144)
Total Protein: 5.8 g/dL — ABNORMAL LOW (ref 6.0–8.5)
eGFR: 32 mL/min/{1.73_m2} — ABNORMAL LOW (ref >59)

## 2022-06-06 LAB — CBC
Hematocrit: 35.8 % — ABNORMAL LOW (ref 37.5–51.0)
Hemoglobin: 11.8 g/dL — ABNORMAL LOW (ref 13.0–17.7)
MCH: 30.3 pg (ref 26.6–33.0)
MCHC: 33 g/dL (ref 31.5–35.7)
MCV: 92 fL (ref 79–97)
Platelets: 104 10*3/uL — ABNORMAL LOW (ref 150–450)
RBC: 3.9 x10E6/uL — ABNORMAL LOW (ref 4.14–5.80)
RDW: 12 % (ref 11.6–15.4)
WBC: 4.3 10*3/uL (ref 3.4–10.8)

## 2022-06-06 LAB — LIPID PANEL WITH LDL/HDL RATIO
Cholesterol, Total: 120 mg/dL (ref 100–199)
HDL: 40 mg/dL (ref >39)
LDL Chol Calc (NIH): 68 mg/dL (ref 0–99)
LDL/HDL Ratio: 1.7 ratio (ref 0.0–3.6)
Triglycerides: 51 mg/dL (ref 0–149)
VLDL Cholesterol Cal: 12 mg/dL (ref 5–40)

## 2022-06-06 LAB — PRO B NATRIURETIC PEPTIDE: NT-Pro BNP: 4525 pg/mL — ABNORMAL HIGH (ref 0–486)

## 2022-06-11 NOTE — Progress Notes (Signed)
Ask if he is dong well. Labs suggest excess fluid in his body. He should continue to be careful with salt intake and restrict calories.  Ask his son to get ProBNP checked when he sees his PCP next and send me the reort   His son can sign up for [employer] and it will make it easy to send message.

## 2022-06-18 IMAGING — CR DG CHEST 2V
2 series · 2 of 2 positions shown · non-contrast
Comparison: 12/02/2015

CLINICAL DATA: Status post pacemaker placement

EXAM:
CHEST - 2 VIEW

[w chest lat]
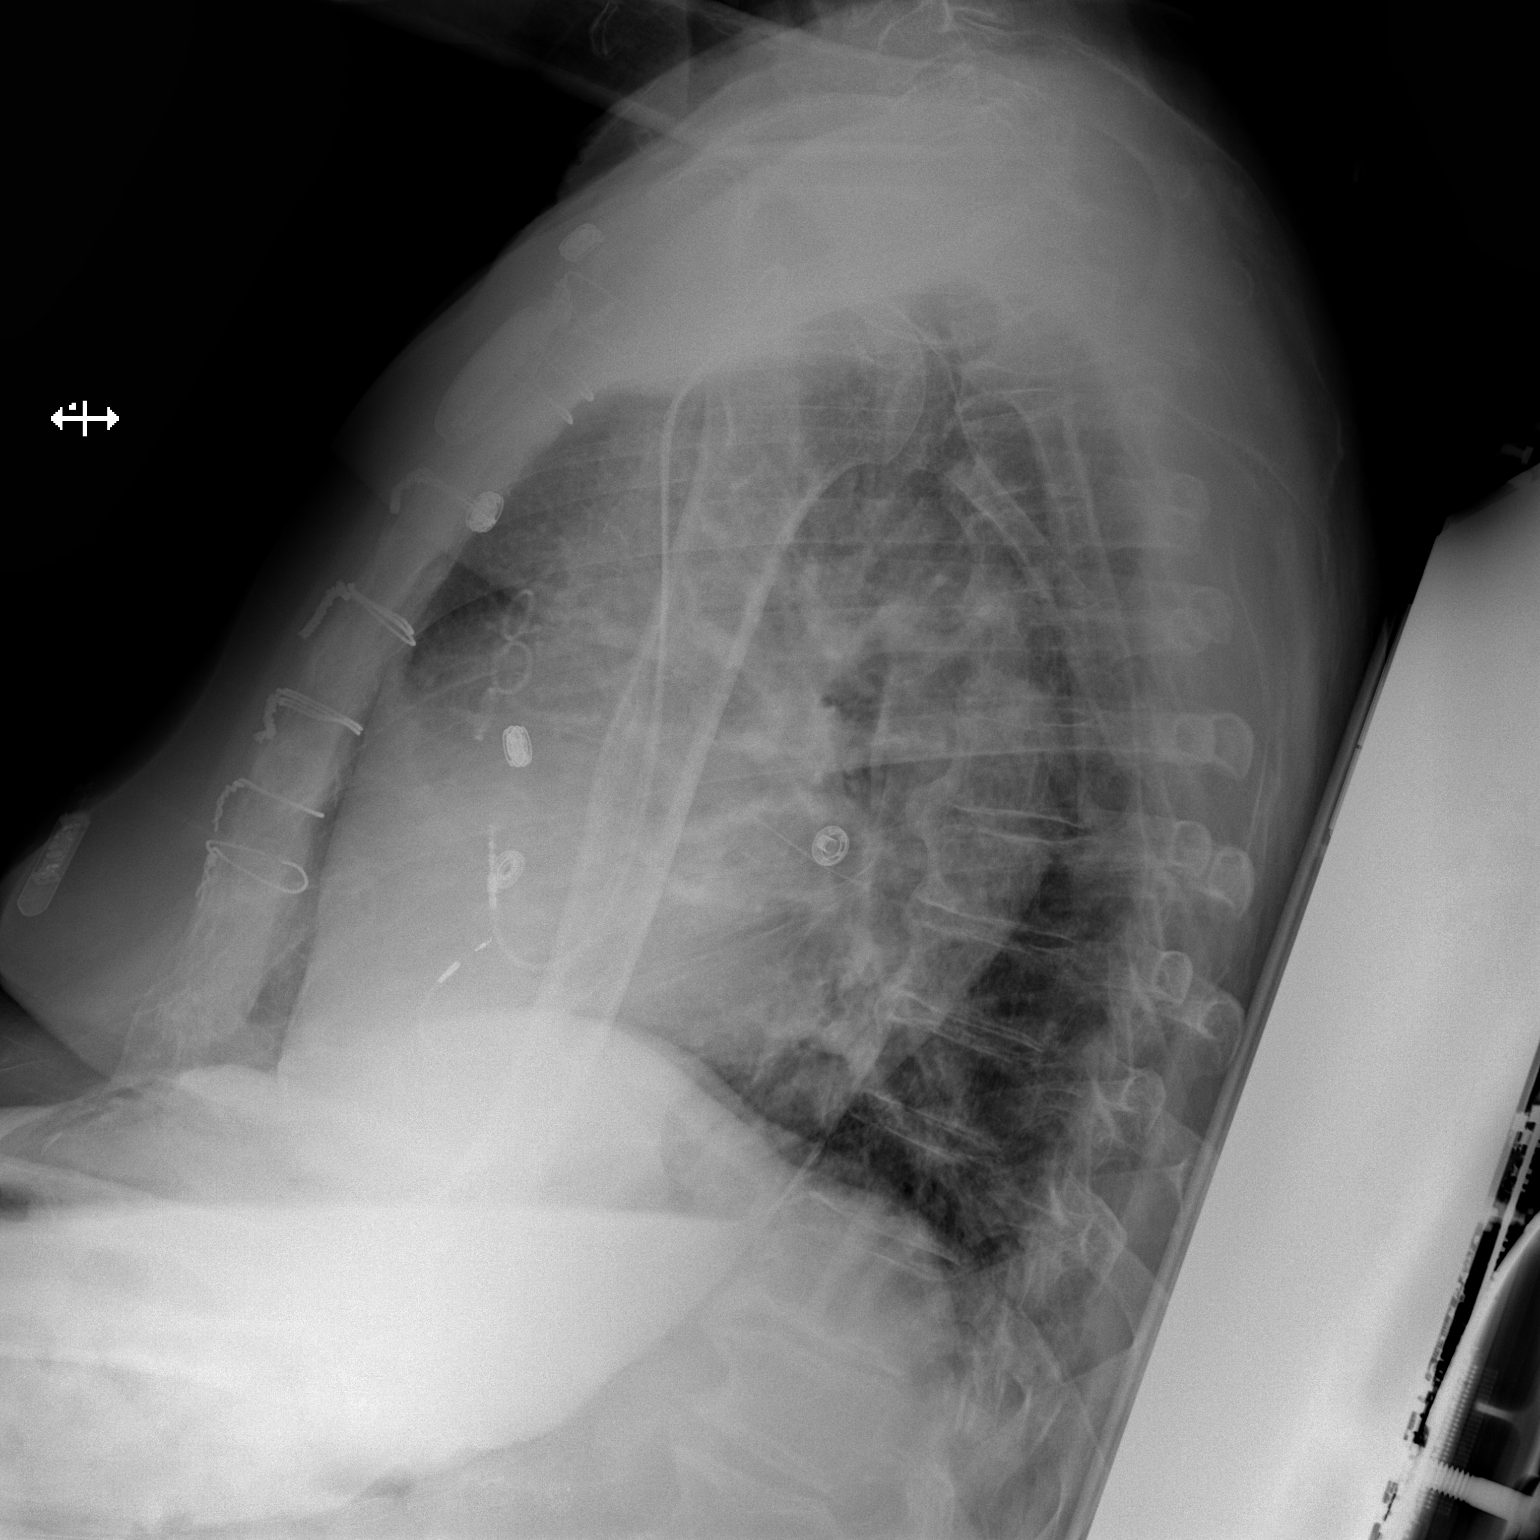

[x chest ap]
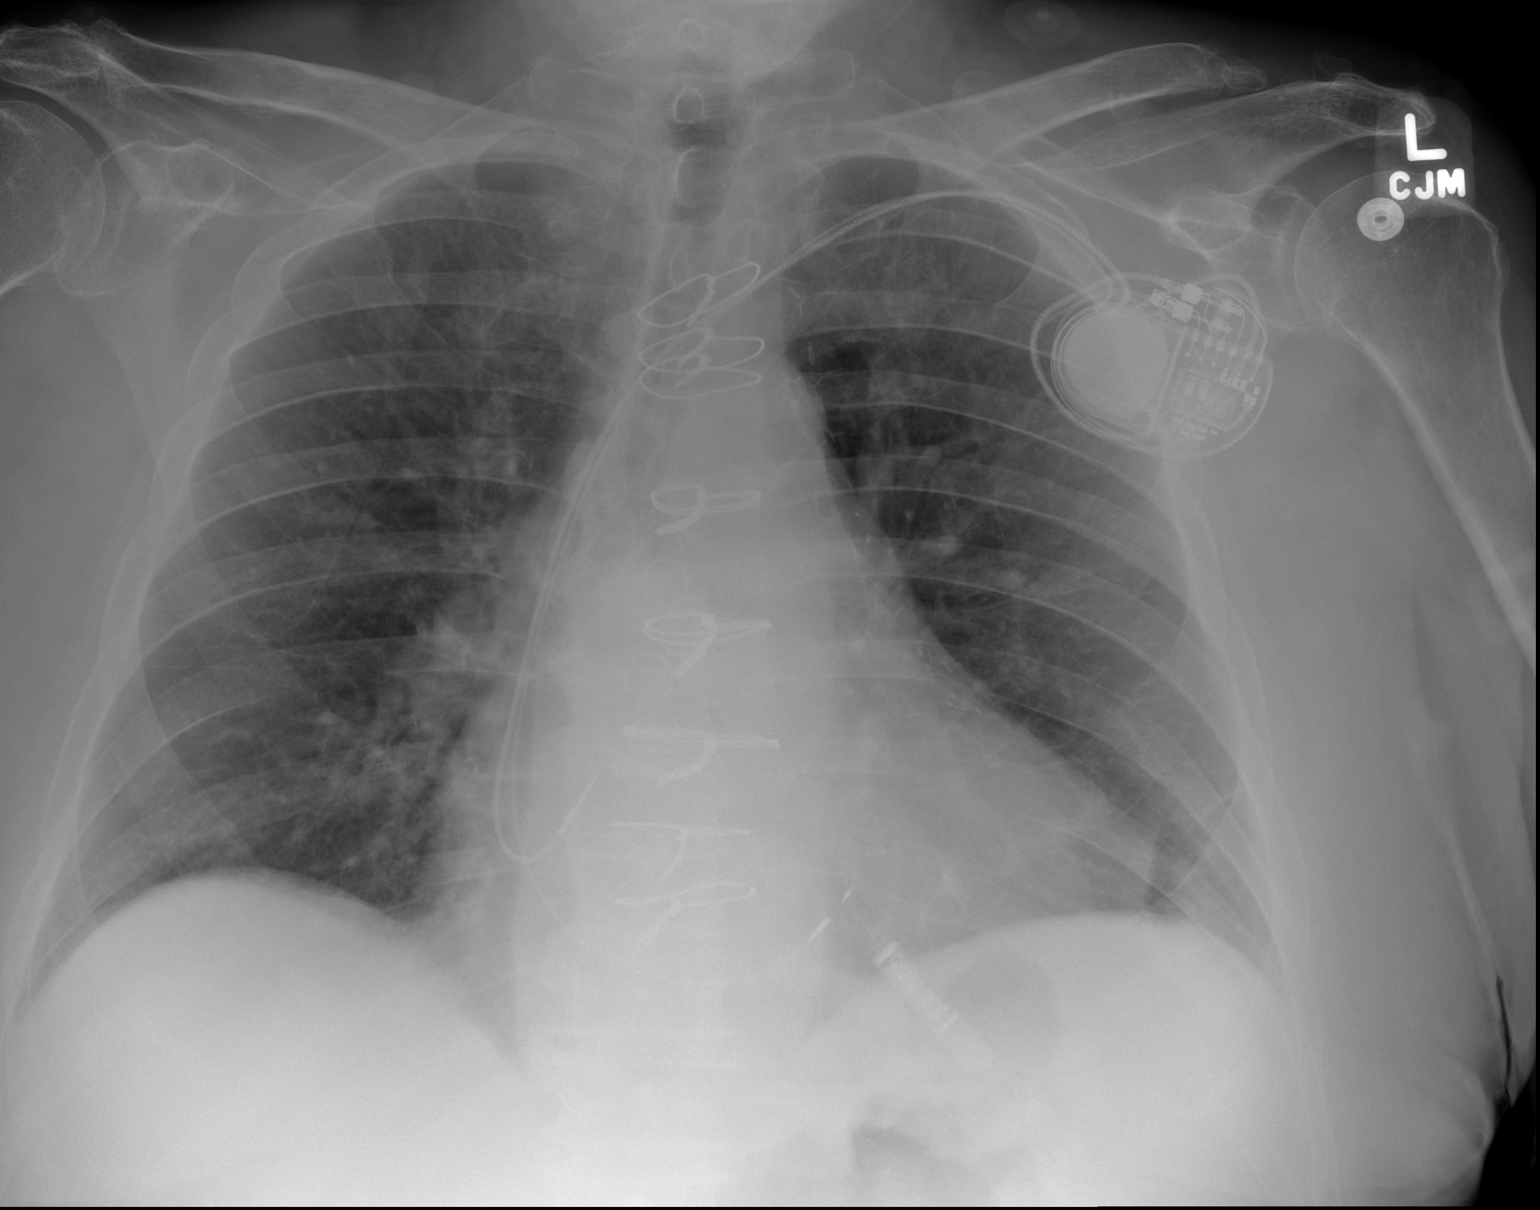

[2 of 2 positions shown; findings below may reference images not displayed]

FINDINGS: Cardiac shadow is enlarged. Loop recorder is again noted and stable.
Postsurgical changes are again seen. New 2 lead pacemaker is noted
on the left without evidence of pneumothorax. Lungs are well aerated
without focal infiltrate.
IMPRESSION: No pneumothorax following pacemaker placement.

## 2022-06-18 NOTE — Progress Notes (Signed)
1 : Spoke with patient's son. He will trying setting up Mychart today. He stated that patient has just been in the bed and not wanting to get and is swelling. He wants to know if patient can get a referral from you for rehab or does he need to contact  PCP. Please advise.   Per Lavone Nian  2: Patient is to take 2 (20mg ) tabs  of Lasix daily (40mg  total)( if he responds(urinates)) do this for one week.  Or... (If he does not pee) Lasix 40mg  daily  Is this correct? Please advise.

## 2022-06-24 ENCOUNTER — Other Ambulatory Visit: Payer: Self-pay

## 2022-06-24 DIAGNOSIS — I5032 Chronic diastolic (congestive) heart failure: Secondary | ICD-10-CM

## 2022-06-24 MED ORDER — FUROSEMIDE 40 MG PO TABS: 20.0000 mg | ORAL_TABLET | ORAL | 2 refills | Status: DC

## 2022-09-30 IMAGING — DX DG CHEST 2V
2 series · 2 of 2 positions shown · non-contrast
Comparison: 03/09/2020.

CLINICAL DATA: Suspected atrial lead dislodgement.

EXAM:
CHEST - 2 VIEW

[dg chest 2 view (1 of 2)]
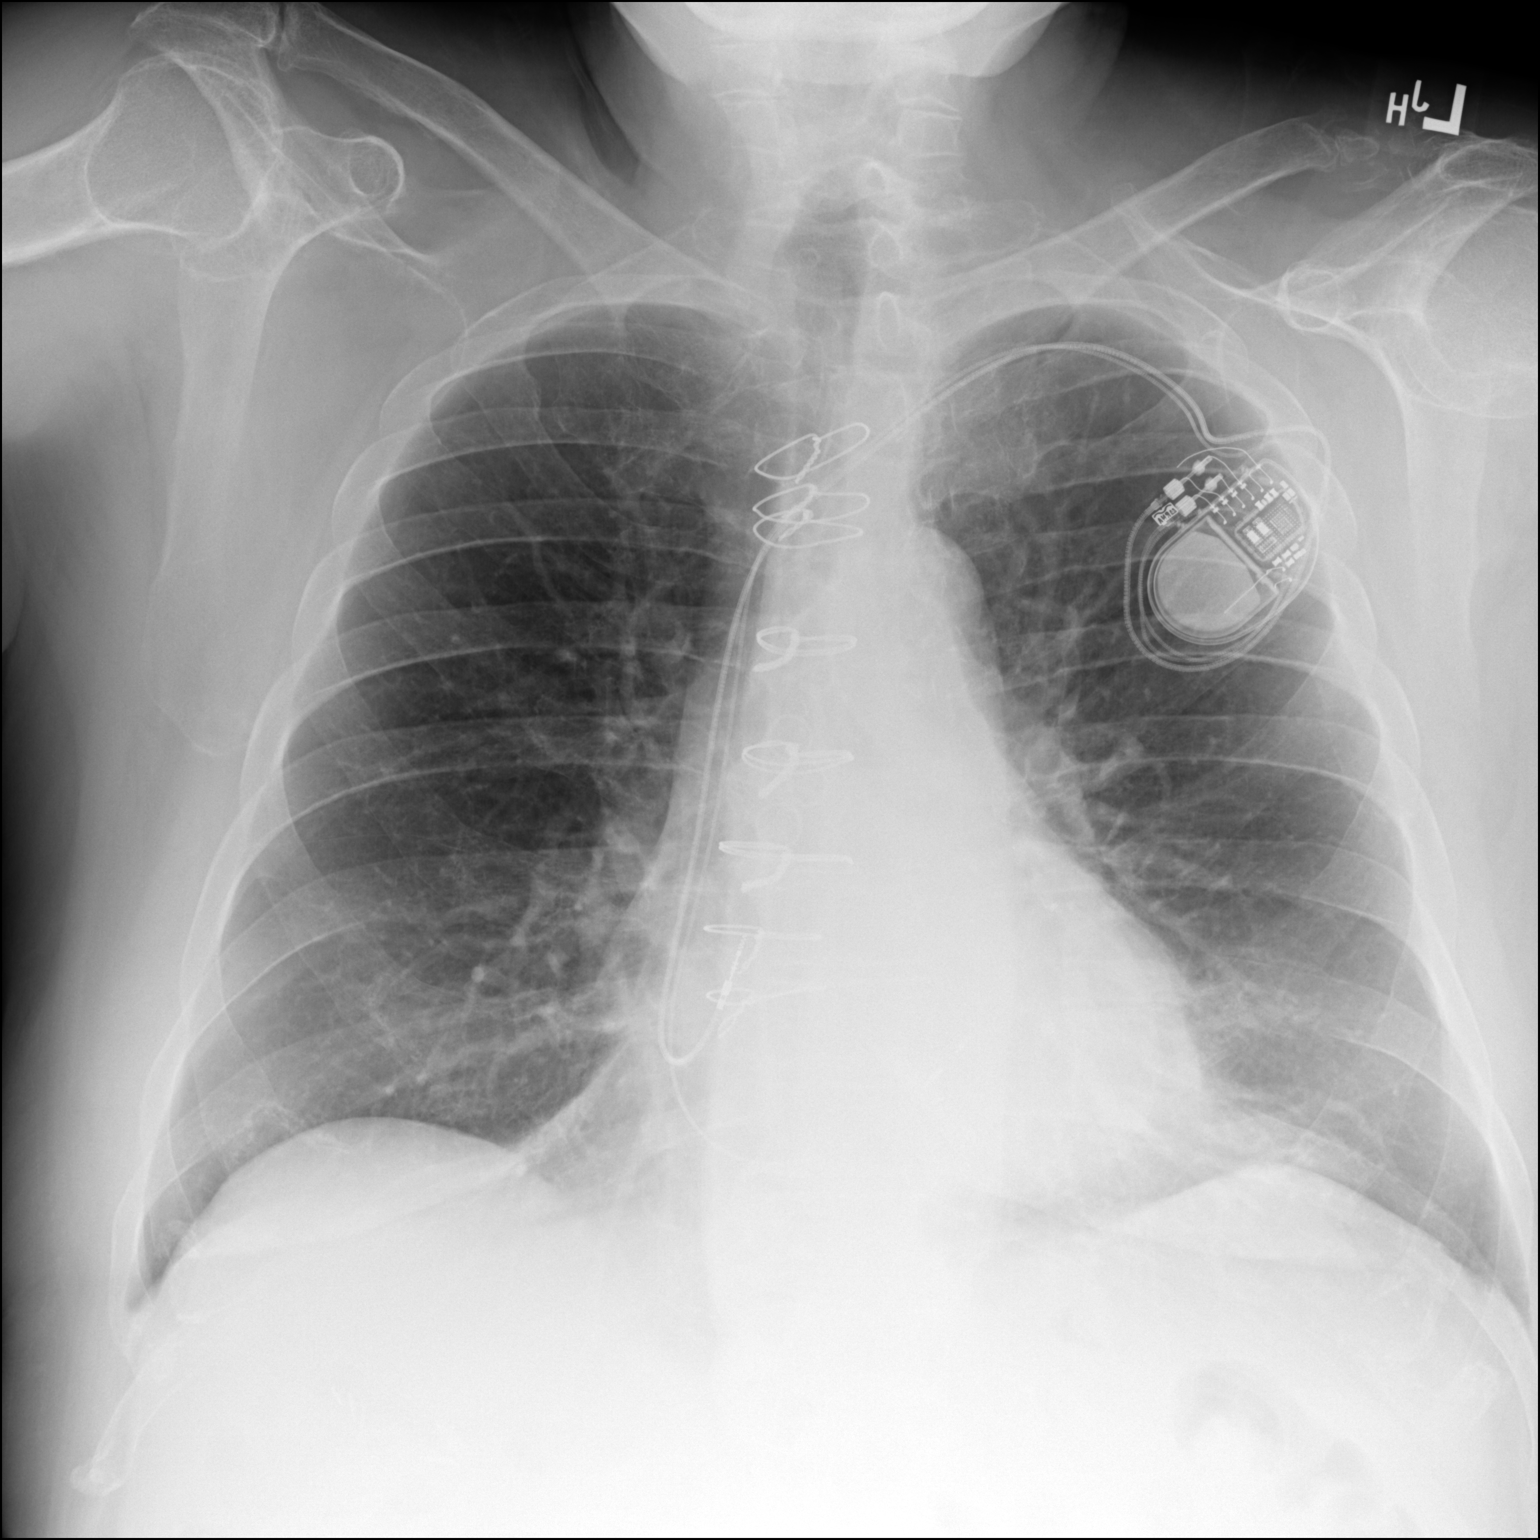

[dg chest 2 view (2 of 2)]
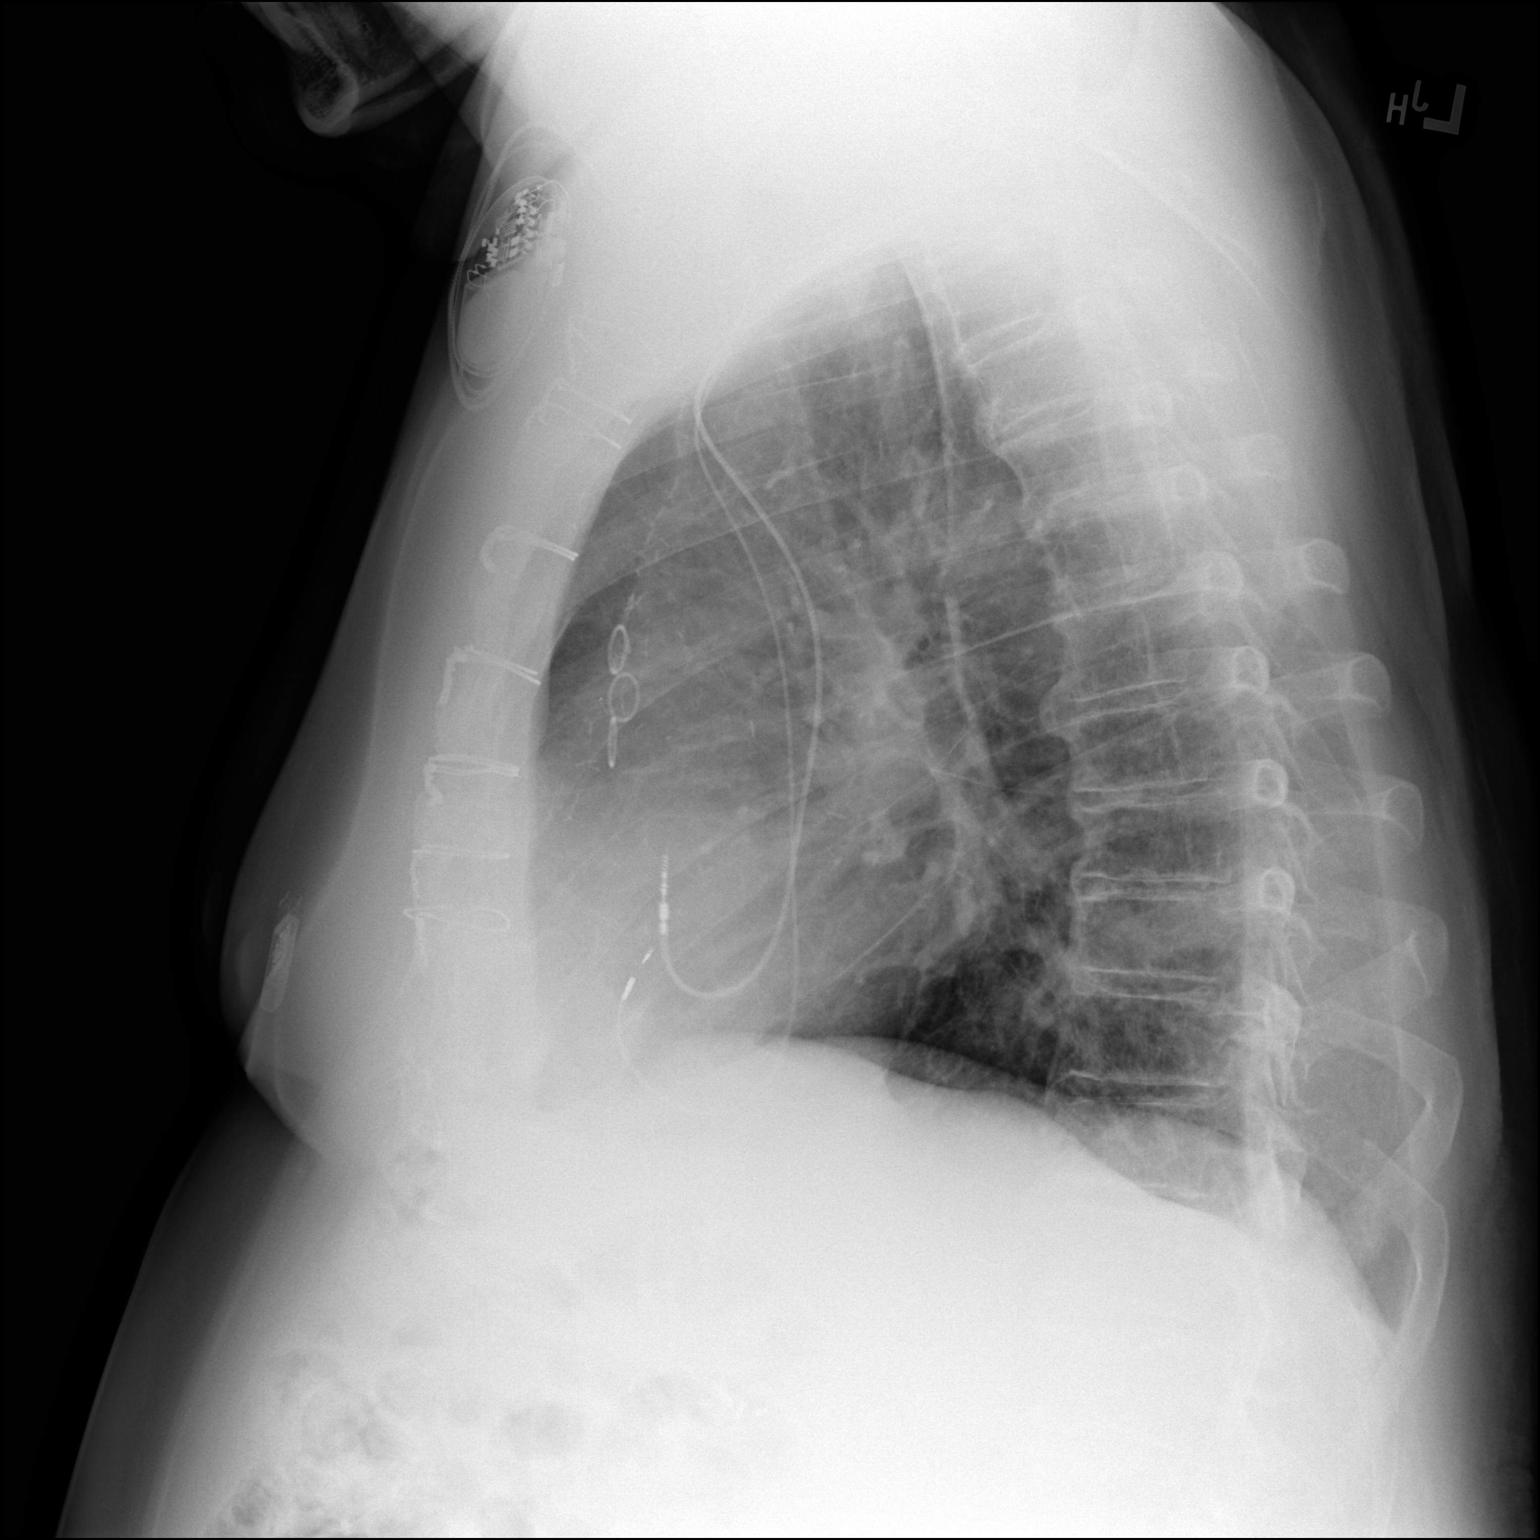

[2 of 2 positions shown; findings below may reference images not displayed]

FINDINGS: Trachea is midline. Heart size stable. Pacemaker lead tips project
over the right atrium and right ventricle. Minimal scarring the lung
bases. Lungs are otherwise clear. No pleural fluid. Flowing anterior
osteophytosis in the thoracic spine.
IMPRESSION: 1. Pacemaker lead tips project over the right atrium and right
ventricle, unchanged from 03/09/2020.
2. No acute findings.

## 2022-11-07 ENCOUNTER — Telehealth: Payer: Self-pay

## 2022-11-07 NOTE — Telephone Encounter (Signed)
Son says dads bp are running very low and his kidney doctor wants him seen earlier than next scheduled appointment. Scheduled for Monday 11/18/22 at 10:15 son will keep record of bps to bring with him to ov.

## 2022-11-18 ENCOUNTER — Ambulatory Visit: Payer: Medicare Other | Admitting: Cardiology

## 2022-11-18 ENCOUNTER — Encounter: Payer: Self-pay | Admitting: Cardiology

## 2022-11-18 VITALS — BP 102/68 | HR 82 | Resp 16 | Ht 67.0 in | Wt 197.8 lb

## 2022-11-18 DIAGNOSIS — I25118 Atherosclerotic heart disease of native coronary artery with other forms of angina pectoris: Secondary | ICD-10-CM

## 2022-11-18 DIAGNOSIS — Z950 Presence of cardiac pacemaker: Secondary | ICD-10-CM

## 2022-11-18 DIAGNOSIS — I951 Orthostatic hypotension: Secondary | ICD-10-CM

## 2022-11-18 DIAGNOSIS — I442 Atrioventricular block, complete: Secondary | ICD-10-CM

## 2022-11-18 DIAGNOSIS — I4821 Permanent atrial fibrillation: Secondary | ICD-10-CM

## 2022-11-18 MED ORDER — MIDODRINE HCL 5 MG PO TABS: 5.0000 mg | ORAL_TABLET | Freq: Three times a day (TID) | ORAL | 1 refills | Status: AC | PRN

## 2022-11-18 NOTE — Progress Notes (Addendum)
Primary Physician/Referring:  Nonnie Done., MD  Patient ID: [name], male    DOB: 1940-10-18, 82 y.o.   MRN: [id number]  Chief Complaint  Patient presents with   Dizziness   Hypotension   Follow-up    HPI:    [name]  is a 82 y.o. 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  Patient was seen by me in January 2024, he made an appointment to see me due to worsening dizziness.  He was found to have very low blood pressure.  Imdur has been discontinued.  Fortunately he has not had any recurrence of chest pain.  He has no developed severe degenerative cervical spine disease, has been scheduled for medial nerve radiofrequency ablation soon.  Otherwise fortunately has not had any chest pain, no dyspnea, he has been losing weight, no leg edema.  Past Medical History:  Diagnosis Date   Chronic kidney disease    Stage IV, nephrologist Dr. Charolette Forward   Coronary artery disease    CABG x4 in 2004   Diabetes [facility])    Encounter for care of pacemaker 03/22/2020   Heart block AV complete (HCC) 03/22/2020   Hyperlipidemia    Hypertension    Myocardial infarction [facility])    Obesity    Obstructive sleep apnea    Pacemaker Medtronic dual-chamber AZURE XT DR MRI 03/09/2020 03/22/2020   Pacemaker Medtronic dual-chamber AZURE XT DR MRI 03/09/2020   Peripheral vascular disease (HCC)    Renal artery angioplast 2004   Family History  Problem Relation Age of Onset   Hypertension Mother    Arthritis Mother    Arthritis Father    Social History   Tobacco Use   Smoking status: Former    Packs/day: 1.50    Years: 25.00    Additional pack years: 0.00     Total pack years: 37.50    Types: Cigarettes    Quit date: 12/18/2001    Years since quitting: 20.9   Smokeless tobacco: Never  Substance Use Topics   Alcohol use: No  Marital Status: Married   ROS  Review of Systems  Cardiovascular:  Negative for chest pain, dyspnea on exertion and leg swelling.  Neurological:  Positive for dizziness and light-headedness.   Objective  Blood pressure 102/68, pulse 82, resp. rate 16, height 5\' 7"  (1.702 m), weight 197 lb 12.8 oz (89.7 kg), SpO2 97 %.     11/18/2022   10:37 AM 05/30/2022    3:44 PM 08/31/2021   12:58 PM  Vitals with BMI  Height 5\' 7"  5\' 7"  5\' 7"   Weight 197 lbs 13 oz 212 lbs 227 lbs  BMI 30.97 33.2 35.55  Systolic 102 122 409  Diastolic 68 72 77  Pulse 82 87 82    Orthostatic VS for the past 72 hrs (Last 3 readings):  Orthostatic BP Patient Position BP Location Cuff Size Orthostatic Pulse  11/18/22 1047 (!) 81/48 Standing Left Arm Normal 64  11/18/22 1046 108/56 Sitting Left Arm Normal 69  11/18/22 1045 114/69 Supine Left Arm Normal 68    Physical Exam Vitals reviewed.  Neck:     Vascular: No carotid bruit or JVD.  Cardiovascular:     Rate and Rhythm:  Normal rate and regular rhythm.     Pulses: Normal pulses and intact distal pulses.     Heart sounds: Normal heart sounds, S1 normal and S2 normal.  Pulmonary:     Effort: Pulmonary effort is normal.     Breath sounds: Normal breath sounds.  Abdominal:     General: Bowel sounds are normal.     Palpations: Abdomen is soft.  Musculoskeletal:     Right lower leg: No edema.     Left lower leg: No edema.  Skin:    Comments: Pacemaker implantation site noted on the left chest.     Laboratory examination:   External labs :  Labs 09/25/2022:  Iron studies normal.  Hb 12.2/HCT 37.6, platelets 05/29/2015.  Serum glucose 117 mg, BUN 25, creatinine 2.0, EGFR 30 to mL, potassium 4.3, magnesium 1.5.  Urinary protein to creatinine ratio 101.  Labs 03/27/2022:  Serum close to  90 mg, BUN 27, creatinine 1.96, EGFR 33 mL, potassium 4.2.  Labs 08/15/2020:  Hb 12.5/HCT 37.5, platelets 129.  Total cholesterol 151, triglycerides 85, HDL 41, LDL 94.  Radiology:   No results found.  Cardiac Studies:   Coronary Angiography  10-11-2011: Native LAD and Cx mid occluded. SVG to Circumflex occluded (new), LIMA to LAD, SVG to D1, SVG to RI patent. (CABG 2004- Lovett Sox, MD)  Steffanie Dunn stress test 02/20/2015: 1. The resting electrocardiogram demonstrated atypical atrial flutter, normal resting conduction, nonspecific ST-T changes.  Stress EKG is non-diagnostic for ischemia as it a pharmacologic stress using Lexiscan. Stress symptoms included dyspnea, dizziness, arm, neck, jaw pain. 2. SPECT images demonstrate homogeneous tracer distribution throughout the myocardium. A very small sized lateral ischemia towards the apex could not be completely excluded. Overall left ventricular systolic function was normal without regional wall motion abnormalities. The left ventricular ejection fraction was 63%.  This is  a low risk stress test. No prior studies to compare.  PCV ECHOCARDIOGRAM COMPLETE 03/07/2020  Narrative Echocardiogram 03/07/2020: Normal LV systolic function with EF 52%. Left ventricle cavity is normal in size. Normal global wall motion. Indeterminate LAP due to atrial fibrillation. Calculated EF 52%. Left atrial cavity is mild to moderately dilated at 4.9 cm. Volume index 29. Trileaflet aortic valve with no regurgitation. Mild aortic valve leaflet calcification. Structurally normal tricuspid valve with trace regurgitation. Compared to the study done on 02/21/2015, mild RV dilatation not present, mild TR with moderate pulmonary potential not noted in the present study.    Pacemaker    Pacemaker Medtronic dual-chamber AZURE XT DR MRI 03/09/2020  Remote dual-chamber pacemaker transmission 10/10/2022: Longevity 10 years and 10 month.  AP <0.1%, VP 98%.  Lead  impedance and thresholds are normal.  A-fib burden 100%.  No high ventricular rate episodes. Patient in sustained atrial fibrillation (100%) since 07/18/2021.  Lead impedance and thresholds are normal.  Normal pacemaker function. .    EKG   EKG 11/18/2022: Underlying atrial fibrillation with ventricular paced rhythm at rate of 62 bpm.  No further analysis.  Left bundle pacing suspected.  Compared to 05/30/2022, no significant change.  Allergies   Allergies  Allergen Reactions   Codeine Hives    Unknown per pt     Codeine Phosphate     Unknown per pt    Current Outpatient Medications:    apixaban (ELIQUIS) 5 MG TABS tablet, Take 1 tablet (5 mg total) by mouth 2 (two) times daily., Disp: 180 tablet, Rfl: 3   atorvastatin (LIPITOR) 40 MG tablet, Take  40 mg by mouth daily. , Disp: , Rfl:    Blood Glucose Monitoring Suppl (ONETOUCH VERIO REFLECT) w/Device KIT, USE TO CHECK BLOOD SUGAR AS DIRECTED, Disp: , Rfl:    Blood Glucose Monitoring Suppl (ONETOUCH VERIO) w/Device KIT, by Does not apply route., Disp: , Rfl:    furosemide (LASIX) 40 MG tablet, Take 0.5 tablets (20 mg total) by mouth every other day. May take 40mg  prn edema, Disp: 90 tablet, Rfl: 2   HYDROcodone-acetaminophen (NORCO) 10-325 MG tablet, Take 1 tablet by mouth every 8 (eight) hours as needed., Disp: , Rfl:    midodrine (PROAMATINE) 5 MG tablet, Take 1 tablet (5 mg total) by mouth 3 (three) times daily as needed. With food, take only when physically active and on your feet, Disp: 90 tablet, Rfl: 1   nitroGLYCERIN (NITROSTAT) 0.4 MG SL tablet, Place 0.4 mg under the tongue every 5 (five) minutes as needed for chest pain (up to 3 doses max). For chest. pain needs refill., Disp: , Rfl:    omeprazole (PRILOSEC) 20 MG capsule, Take 20 mg by mouth daily., Disp: , Rfl:    tamsulosin (FLOMAX) 0.4 MG CAPS capsule, Take 1 capsule by mouth at bedtime., Disp: , Rfl:    Assessment     ICD-10-CM   1. Orthostatic hypotension  I95.1 midodrine  (PROAMATINE) 5 MG tablet    PCV ECHOCARDIOGRAM COMPLETE    2. Coronary artery disease of native artery of native heart with stable angina pectoris (HCC)  I25.118 PCV ECHOCARDIOGRAM COMPLETE    3. Permanent atrial fibrillation (HCC)  I48.21 EKG 12-Lead    4. Complete AV block (HCC)  I44.2     5. Pacemaker Medtronic dual-chamber AZURE XT DR MRI 03/09/2020  Z95.0       Meds ordered this encounter  Medications   midodrine (PROAMATINE) 5 MG tablet    Sig: Take 1 tablet (5 mg total) by mouth 3 (three) times daily as needed. With food, take only when physically active and on your feet    Dispense:  90 tablet    Refill:  1    Medications Discontinued During This Encounter  Medication Reason   gabapentin (NEURONTIN) 100 MG capsule    metoprolol succinate (TOPROL-XL) 50 MG 24 hr tablet Discontinued by provider   FLUoxetine (PROZAC) 20 MG capsule Discontinued by provider   isosorbide mononitrate (IMDUR) 60 MG 24 hr tablet Discontinued by provider    Recommendations:   Cruz Mingus  is a 82 y.o. 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   1. Orthostatic hypotension Present patient made an appointment to see me due to severe dizziness, his Imdur has been discontinued by PCP.  He is still severely orthostatic and it is related to combination of diabetes mellitus and also chronic kidney disease.  I would like to exclude any change in his cardiac function, an echocardiogram will be obtained.  We discussed regarding using support stockings and also to use midodrine on a as needed basis. I will go ahead and discontinue metoprolol to tartrate as  well, patient has complete heart block and hence I am not concerned about atrial fibrillation with rapid ventricular response.  From cardiac standpoint he has not had any  further episodes of angina pectoris over the past at least a year.  If he has recurrence of angina pectoris we will deal with it at that point. - midodrine (PROAMATINE) 5 MG tablet; Take 1 tablet (5 mg total) by mouth 3 (three) times daily as needed. With food, take only when physically active and on your feet  Dispense: 90 tablet; Refill: 1 - PCV ECHOCARDIOGRAM COMPLETE; Future  2. Coronary artery disease of native artery of native heart with stable angina pectoris [facility]) Previously has had stable angina but he has not had any further episodes of chest pain.  He is presently doing well hence I am fine discontinuing isosorbide mononitrate and also metoprolol.  Hopefully this will help with orthostatic hypotension. - PCV ECHOCARDIOGRAM COMPLETE; Future  3. Permanent atrial fibrillation [facility]) Patient has permanent atrial fibrillation.  His heart rate is controlled as he has underlying complete heart block and is pacer dependent. - EKG 12-Lead  4. Complete AV block (HCC) As dictated above.  5. Pacemaker Medtronic dual-chamber AZURE XT DR MRI 03/09/2020 Pacemaker is functioning normally.  I will see him back in 6 months for follow-up.  Patient has severe neck pain, has been scheduled for radiofrequency ablation of cervical medial branch block, this will certainly interfere with his pacemaker however magnet can be placed as he is pacer dependent and should undergo the procedure with low risk.    Yates Decamp, MD, [facility] 11/18/2022, 11:13 AM Office: [phone] Fax: [phone] Pager: [phone]

## 2023-01-10 ENCOUNTER — Ambulatory Visit: Payer: Medicare Other

## 2023-01-10 DIAGNOSIS — I951 Orthostatic hypotension: Secondary | ICD-10-CM

## 2023-01-10 DIAGNOSIS — I25118 Atherosclerotic heart disease of native coronary artery with other forms of angina pectoris: Secondary | ICD-10-CM

## 2023-01-13 NOTE — Progress Notes (Signed)
Heart function is normal.  Echocardiogram 01/10/2023: Normal LV systolic function with visual EF 50-55%. Left ventricle cavity is normal in size. Moderate concentric hypertrophy of the left ventricle. Normal global wall motion. Abnormal septal wall motion due to post-operative septum. Unable to evaluate diastolic function due to atrial fibrillation.  Left atrial cavity is mildly dilated at 41.13 ml/m^2. Aortic valve sclerosis without stenosis. Mild tricuspid regurgitation. No evidence of pulmonary hypertension. Compared to 03/07/2020 moderate LAE is mild otherwise no significant change.

## 2023-01-13 NOTE — Progress Notes (Signed)
Spoke with patient son regarding his echocardiogram resutls.

## 2023-02-19 ENCOUNTER — Telehealth: Payer: Self-pay | Admitting: Cardiology

## 2023-02-19 NOTE — Telephone Encounter (Signed)
Called and spoke w patient's son.  He said that they ran out of Eliquis 3 days ago and are requesting samples from Dr. Jacinto Halim.  I reviewed policy of the 30 day free trial card and then the application for patient assistance - and during the application process samples are provided but not otherwise.  I suggested coming to office to get the card or downloading it himself online and taking to pharmacy today so that pt can get the medicine.  He said it will be hopefully tomorrow that he can do this.  He is unsure about what insurance pays - he just knows that $150 is unaffordable for his dad and that is why he is always provided samples.  If there is a cheaper alternative, they will consider that as well.   Patient's son asked that I inform Dr. Jacinto Halim of all of this, including that the patient's wife passed away last week and that is the reason he didn't call sooner for the samples.

## 2023-02-19 NOTE — Telephone Encounter (Signed)
We should do patient assist

## 2023-02-19 NOTE — Telephone Encounter (Signed)
Patient calling the office for samples of medication:   1.  What medication and dosage are you requesting samples for? apixaban (ELIQUIS) 5 MG TABS tablet  2.  Are you currently out of this medication? Yes    

## 2023-02-20 ENCOUNTER — Telehealth: Payer: Self-pay

## 2023-02-20 ENCOUNTER — Other Ambulatory Visit (HOSPITAL_COMMUNITY): Payer: Self-pay

## 2023-02-20 NOTE — Telephone Encounter (Signed)
Spoke with daughter, she is aware of what she needs to provide [place] in order for [place] to submit application. Waiting on income documentation to start PAP.

## 2023-02-20 NOTE — Telephone Encounter (Signed)
Spoke with daughter, she is gathering income documents and rx spending report and will email it to me once she has it. Need to show company that patient has spent at least 3% of income on drug costs for approval.

## 2023-02-26 ENCOUNTER — Other Ambulatory Visit: Payer: Self-pay

## 2023-02-26 DIAGNOSIS — I483 Typical atrial flutter: Secondary | ICD-10-CM

## 2023-02-26 NOTE — Telephone Encounter (Signed)
Per Thayer Ohm, [facility] patient should be on Eliquis 2.5 mg, D/C 5mg 

## 2023-02-26 NOTE — Telephone Encounter (Signed)
Prescription refill request for Eliquis received. Indication: Afib  Last office visit: 11/18/22 Jacinto Halim)  Scr: 2.01 (06/05/22)  Age: 82 Weight: 89.7kg  Current dose not appropriate. Will forward to PharmD for review.

## 2023-02-26 NOTE — Telephone Encounter (Signed)
Of note, patient should be on Eliquis 2.5mg  BID due to age and renal function

## 2023-03-04 MED ORDER — APIXABAN 2.5 MG PO TABS: 2.5000 mg | ORAL_TABLET | Freq: Two times a day (BID) | ORAL | 6 refills | Status: DC

## 2023-03-04 NOTE — Telephone Encounter (Signed)
Spoke to son, Timothy Lasso. Aware of dose change needed, and that Dr. Jacinto Halim is agreeable. Zack reports that they tried to talk w/ dad (pt) about cost issue/assistance.  He says that dad got very embarrassed and did not want to talk about it.  Timothy Lasso says that with pt's wife recently passing, they just have a lot on their plate, he does not want to stress dad anymore than needed right now and they decided they were going to pay out of pocket for medication for now.  Not the right time to deal w/ this currently. Discussed downloading app of Eliquis website if/when needed and/or calling us to assist w/ cost.  Son is agreeable to plan and appreciates my follow up on this.

## 2023-03-10 NOTE — Telephone Encounter (Signed)
LM to follow up on requested info on Kim's cell

## 2023-05-05 ENCOUNTER — Telehealth: Payer: Self-pay | Admitting: Cardiology

## 2023-05-05 NOTE — Telephone Encounter (Signed)
Pts son called to report that the pot has been having elevated BP.. pt says he is feeling fine but they are concerned about the readings:   Over the past week:   Random BP checks after resting:   153/83 139/75 1240/78 178/113 185/114 167/88 182/107 176/96 186/115  Pt does not have any symptoms... no sx of infection such as UTI but he is calling to get him in with his PCP this week.   His HR has been staying between 75-85.   Pts last OV with Dr Jacinto Halim 11/2022.. he had hypotension and had most of his meds d/c'd and added Midodrine but his son feels certain he is not taking it but will double check his meds at his home when he gets there later today.   He will continue to monitor... DR Jacinto Halim is at the hosp and I will send to him for his review and if he is unable to respond I will talk with the DOD.

## 2023-05-05 NOTE — Telephone Encounter (Signed)
Pt c/o BP issue: STAT if pt c/o blurred vision, one-sided weakness or slurred speech  1. What are your last 5 BP readings? 308/657,846/96  2. Are you having any other symptoms (ex. Dizziness, headache, blurred vision, passed out)? No   3. What is your BP issue? BP has been running a little high

## 2023-05-05 NOTE — Telephone Encounter (Signed)
Problem is that he is hypertensive sitting or laying down, however he has severe orthostatic hypotension and dropped his blood pressure while standing.  They need to send his BP readings standing and laying down.

## 2023-05-05 NOTE — Telephone Encounter (Signed)
Pts son will take his orthostatics and willl let us know... pt is seeing PCP this Thursday.   Pts son asking fi he can bring him in sooner than 06/2023.

## 2023-05-05 NOTE — Telephone Encounter (Signed)
Yes, let him see his PCP first maybe if needed, I can see him sooner also

## 2023-05-06 NOTE — Telephone Encounter (Signed)
I spoke with patient's son and gave him message from Dr Jacinto Halim.  Son will take patient's BP readings to appointment with PCP later this week.  Patient's son will call office back if PCP feels sooner appointment with Dr Jacinto Halim needed.

## 2023-05-06 NOTE — Telephone Encounter (Signed)
Left message to call office

## 2023-05-06 NOTE — Telephone Encounter (Signed)
Patient's son is returning call. 

## 2023-05-07 ENCOUNTER — Ambulatory Visit (INDEPENDENT_AMBULATORY_CARE_PROVIDER_SITE_OTHER): Payer: Medicare Other

## 2023-05-07 DIAGNOSIS — I442 Atrioventricular block, complete: Secondary | ICD-10-CM | POA: Diagnosis not present

## 2023-05-07 LAB — CUP PACEART REMOTE DEVICE CHECK
Battery Remaining Longevity: 123 mo
Battery Voltage: 3.01 V
Brady Statistic RA Percent Paced: 0 %
Brady Statistic RV Percent Paced: 91.41 %
Date Time Interrogation Session: 20241218012623
Implantable Lead Connection Status: 753985
Implantable Lead Connection Status: 753985
Implantable Lead Implant Date: 20211021
Implantable Lead Implant Date: 20211021
Implantable Lead Location: 753859
Implantable Lead Location: 753860
Implantable Lead Model: 3830
Implantable Lead Model: 5076
Implantable Pulse Generator Implant Date: 20211021
Lead Channel Impedance Value: 304 Ohm
Lead Channel Impedance Value: 361 Ohm
Lead Channel Impedance Value: 437 Ohm
Lead Channel Impedance Value: 513 Ohm
Lead Channel Pacing Threshold Amplitude: 0.75 V
Lead Channel Pacing Threshold Amplitude: 0.875 V
Lead Channel Pacing Threshold Pulse Width: 0.4 ms
Lead Channel Pacing Threshold Pulse Width: 0.4 ms
Lead Channel Sensing Intrinsic Amplitude: 0.625 mV
Lead Channel Sensing Intrinsic Amplitude: 0.625 mV
Lead Channel Sensing Intrinsic Amplitude: 19.875 mV
Lead Channel Sensing Intrinsic Amplitude: 19.875 mV
Lead Channel Setting Pacing Amplitude: 1.5 V
Lead Channel Setting Pacing Amplitude: 2 V
Lead Channel Setting Pacing Pulse Width: 0.4 ms
Lead Channel Setting Sensing Sensitivity: 1.2 mV
Zone Setting Status: 755011

## 2023-06-05 ENCOUNTER — Ambulatory Visit: Payer: Self-pay | Admitting: Cardiology

## 2023-06-15 ENCOUNTER — Other Ambulatory Visit: Payer: Self-pay | Admitting: Cardiology

## 2023-06-15 DIAGNOSIS — I5032 Chronic diastolic (congestive) heart failure: Secondary | ICD-10-CM

## 2023-06-16 NOTE — Progress Notes (Signed)
Remote pacemaker transmission.

## 2023-06-17 MED ORDER — FUROSEMIDE 40 MG PO TABS: 20.0000 mg | ORAL_TABLET | ORAL | 1 refills | Status: AC

## 2023-07-11 ENCOUNTER — Ambulatory Visit: Payer: Medicare Other | Attending: Cardiology | Admitting: Cardiology

## 2023-07-11 ENCOUNTER — Telehealth: Payer: Self-pay

## 2023-07-11 ENCOUNTER — Encounter: Payer: Self-pay | Admitting: Cardiology

## 2023-07-11 ENCOUNTER — Telehealth: Payer: Self-pay | Admitting: Cardiology

## 2023-07-11 VITALS — BP 152/75 | HR 63 | Resp 16 | Ht 67.0 in | Wt 198.2 lb

## 2023-07-11 DIAGNOSIS — I4821 Permanent atrial fibrillation: Secondary | ICD-10-CM

## 2023-07-11 DIAGNOSIS — I951 Orthostatic hypotension: Secondary | ICD-10-CM

## 2023-07-11 DIAGNOSIS — I10 Essential (primary) hypertension: Secondary | ICD-10-CM | POA: Diagnosis not present

## 2023-07-11 DIAGNOSIS — I442 Atrioventricular block, complete: Secondary | ICD-10-CM

## 2023-07-11 DIAGNOSIS — I25118 Atherosclerotic heart disease of native coronary artery with other forms of angina pectoris: Secondary | ICD-10-CM | POA: Diagnosis not present

## 2023-07-11 MED ORDER — APIXABAN 2.5 MG PO TABS: 2.5000 mg | ORAL_TABLET | Freq: Two times a day (BID) | ORAL | 1 refills | Status: DC

## 2023-07-11 MED ORDER — APIXABAN 5 MG PO TABS: 5.0000 mg | ORAL_TABLET | ORAL | 3 refills | Status: DC

## 2023-07-11 NOTE — Telephone Encounter (Signed)
Prescription refill request for Eliquis received. Indication:afib Last office visit:2/25 Scr:2.08  12/24 Age: 83 Weight:89.9  kg  Under review

## 2023-07-11 NOTE — Telephone Encounter (Signed)
Lpmtcb to discuss Eliquis Refill

## 2023-07-11 NOTE — Telephone Encounter (Signed)
Please let them know the Insurance will not send 180 tab if 5 mg ordered and they will revert to 2.5 mg and we cannot wrongly Rx 5 mg BID and we either have to send in as 2.5 mg BID or 5 mg 1/2 BID

## 2023-07-11 NOTE — Telephone Encounter (Signed)
Okay, thanks. I have already sent the Rx and I see the correction. I am good with this

## 2023-07-11 NOTE — Progress Notes (Signed)
Cardiology Office Note:  .   Date:  07/11/2023  ID:  [name], DOB [birth year], MRN [id number] PCP: Xavier Done., MD  [facility] Providers Cardiologist:  None   History of Present Illness: .   [name] is a 83 y.o.  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  Patient presently remains asymptomatic, he has not had any, he has been careful with his diet and has continued to lose weight.  No leg edema, no PND or orthopnea, no chest pain or palpitations.  Discussed the use of AI scribe software for clinical note transcription with the patient, who gave verbal consent to proceed.  History of Present Illness   The patient, with a history of hypertension and a pacemaker, presents for a routine follow-up. The patient's family member reports that the patient has been more active recently and has lost weight. The patient has been experiencing fluctuations in blood pressure, with orthostatic hypotension noted. The patient's pacemaker is functioning well and has approximately ten years of battery life remaining. The patient is on Eliquis for anticoagulation, and there was a discussion about the dosage of this medication. The patient is planning to travel in the near future and has no restrictions related to his medical conditions.        Labs   Lab Results  Component Value Date   CHOL 120 06/05/2022   HDL 40 06/05/2022   LDLCALC 68 06/05/2022   TRIG 51 06/05/2022   CHOLHDL 5 02/27/2010   Lab Results  Component Value Date   NA 142 06/05/2022   K 5.1 06/05/2022   CO2 26  06/05/2022   GLUCOSE 123 (H) 06/05/2022   BUN 22 06/05/2022   CREATININE 2.01 (H) 06/05/2022   CALCIUM 8.1 (L) 06/05/2022   EGFR 32 (L) 06/05/2022   GFRNONAA 31 (L) 03/07/2020      Latest Ref Rng & Units 06/05/2022    1:11 PM 03/07/2020   10:11 AM 06/07/2015    1:30 PM  BMP  Glucose 70 - 99 mg/dL 829  562  130   BUN 8 - 27 mg/dL 22  30  26    Creatinine 0.76 - 1.27 mg/dL 8.65  7.84  6.96   BUN/Creat Ratio 10 - 24 11  15     Sodium 134 - 144 mmol/L 142  141  139   Potassium 3.5 - 5.2 mmol/L 5.1  4.5  4.2   Chloride 96 - 106 mmol/L 106  107  103   CO2 20 - 29 mmol/L 26  25  24    Calcium 8.6 - 10.2 mg/dL 8.1  8.5  9.0       Latest Ref Rng & Units 06/05/2022    1:11 PM 03/07/2020   10:11 AM 06/07/2015    1:30 PM  CBC  WBC 3.4 - 10.8 x10E3/uL 4.3  7.0  6.1   Hemoglobin 13.0 - 17.7 g/dL 29.5  28.4  13.2   Hematocrit 37.5 - 51.0 % 35.8  36.5  42.0   Platelets 150 - 450 x10E3/uL 104  126  167    External Labs:  Care Everywhere labs  06/05/2022:  Total cholesterol 120, triglycerides 51, HDL 40,  LDL 68.  Labs 04/03/2023:  Serum glucose 177, BUN 25, creatinine 2.07, EGFR 31 mL.  Iron studies normal.  Hb 12.2/HCT 37.6, platelets 117.  Magnesium 1.5.  Review of Systems  Cardiovascular:  Negative for chest pain, dyspnea on exertion, leg swelling and syncope.  Neurological:  Positive for dizziness.   Physical Exam:   VS:  BP (!) 152/75 (BP Location: Left Arm, Patient Position: Supine, Cuff Size: Large)   Pulse 63   Resp 16   Ht 5\' 7"  (1.702 m)   Wt 198 lb 3.2 oz (89.9 kg)   SpO2 94%   BMI 31.04 kg/m    Wt Readings from Last 3 Encounters:  07/11/23 198 lb 3.2 oz (89.9 kg)  11/18/22 197 lb 12.8 oz (89.7 kg)  05/30/22 212 lb (96.2 kg)     Physical Exam Neck:     Vascular: No carotid bruit or JVD.  Cardiovascular:     Rate and Rhythm: Normal rate and regular rhythm.     Pulses: Intact distal pulses.          Dorsalis pedis pulses are 1+ on the right side and 1+ on the  left side.       Posterior tibial pulses are 1+ on the right side and 1+ on the left side.     Heart sounds: Normal heart sounds. No murmur heard.    No gallop.  Pulmonary:     Effort: Pulmonary effort is normal.     Breath sounds: Normal breath sounds.  Abdominal:     General: Bowel sounds are normal.     Palpations: Abdomen is soft.  Musculoskeletal:     Right lower leg: No edema.     Left lower leg: No edema.    Studies Reviewed: Marland Kitchen    Coronary Angiography  10/08/11: Native LAD and Cx mid occluded. SVG to Circumflex occluded (new), LIMA to LAD, SVG to D1, SVG to RI patent. (CABG 2004Lovett Sox, MD)   Pacemaker Medtronic dual-chamber AZURE XT DR MRI 03/09/2020   Remote dual-chamber pacemaker transmission 05/07/2023: Patient in persistent atrial fibrillation.  Normal device function.  V paced 92%. Longevity 10 years.  EKG:    EKG Interpretation Date/Time:  Friday July 11 2023 10:08:33 EST Ventricular Rate:  74 PR Interval:    QRS Duration:  102 QT Interval:  418 QTC Calculation: 463 R Axis:   -16  Text Interpretation: EKG 07/11/2023: Underlying atrial fibrillation with ventricularly paced rhythm at the rate of 74 bpm.  Left bundle pacing detected.  No significant change from 11/18/2022. Confirmed by [name] [phone]) on 07/11/2023 10:12:15 AM    EKG 11/18/2022: Underlying atrial fibrillation with ventricular paced rhythm at rate of 62 bpm. No further analysis. Left bundle pacing suspected.   Medications and allergies    Allergies  Allergen Reactions   Codeine Hives    Unknown per pt     Codeine Phosphate     Unknown per pt     Current Outpatient Medications:    atorvastatin (LIPITOR) 40 MG tablet, Take 40 mg by mouth daily. , Disp: , Rfl:    furosemide (LASIX) 40 MG tablet, Take 0.5 tablets (20 mg total) by mouth every other day. May take 40mg  prn edema, Disp: 75 tablet, Rfl: 1   HYDROcodone-acetaminophen (NORCO) 10-325 MG tablet, Take 1 tablet by  mouth every 8 (eight) hours as needed., Disp: , Rfl:    methocarbamol (ROBAXIN) 500 MG tablet, Take 500 mg by mouth 3 (three) times  daily as needed., Disp: , Rfl:    midodrine (PROAMATINE) 5 MG tablet, Take 1 tablet (5 mg total) by mouth 3 (three) times daily as needed. With food, take only when physically active and on your feet, Disp: 90 tablet, Rfl: 1   omeprazole (PRILOSEC) 20 MG capsule, Take 20 mg by mouth daily., Disp: , Rfl:    tamsulosin (FLOMAX) 0.4 MG CAPS capsule, Take 1 capsule by mouth at bedtime., Disp: , Rfl:    apixaban (ELIQUIS) 5 MG TABS tablet, Take 1 tablet (5 mg total) by mouth as directed. 1/2 Tab twice daily, Disp: 180 tablet, Rfl: 3   nitroGLYCERIN (NITROSTAT) 0.4 MG SL tablet, Place 0.4 mg under the tongue every 5 (five) minutes as needed for chest pain (up to 3 doses max). For chest. pain needs refill. (Patient not taking: Reported on 07/11/2023), Disp: , Rfl:    ASSESSMENT AND PLAN: .      ICD-10-CM   1. Orthostatic hypotension  I95.1 EKG 12-Lead    2. Supine hypertension  I10     3. Coronary artery disease of native artery of native heart with stable angina pectoris (HCC)  I25.118     4. Permanent atrial fibrillation (HCC)  I48.21 apixaban (ELIQUIS) 5 MG TABS tablet    5. Complete AV block (HCC)  I44.2      Assessment and Plan    Orthostatic Hypotension Orthostatic hypotension presents with a significant blood pressure drop from 170/74 sitting to 125/70 standing, causing visual disturbances. Previous medication adjustments have improved symptoms. Monitor standing blood pressure weekly during summer. Administer midodrine before increased activity. Emphasize hydration and gradual position changes.  Atrial Fibrillation Currently on Eliquis for atrial fibrillation, preferring the original dosage of 5 mg BID by splitting tablets. Prescribe Eliquis 5 mg tablets with instructions to take half a tablet BID. Monitor for pharmacy dispensing issues.  Pacemaker  Management The pacemaker is functioning well with approximately 10 years of battery life remaining. No immediate concerns for travel or daily activities. Discussed the battery replacement process and reassured there is no immediate need for intervention. Continue routine pacemaker checks and maintain no travel restrictions.  He is pacemaker dependent.  General Health Maintenance Overall health is good with weight loss and increased activity. No signs of heart failure or lung issues. Encourage continued physical activity and regular monitoring of weight and health. Schedule a follow-up in one year.  Follow-up Schedule a follow-up appointment in one year. Contact the provider if any issues with medication or health arise.     CAD: Patient remains asymptomatic without recurrence of angina pectoris.  He 33, will be 83 years of age next month.  continue present management.  i reviewed his external labs, lipids under excellent control, renal function has remained stable at stage iiib chronic kidney disease.  he is tolerating anticoagulation without bleeding diathesis office visit in a year or sooner if problems.      Signed,  Yates Decamp, MD, [facility] 07/11/2023, 11:05 AM [facility] [address] [place], [place] [address] Phone: [phone]. Fax:  [phone]

## 2023-07-11 NOTE — Addendum Note (Signed)
Addended by: Sigurd Sos on: 07/11/2023 03:33 PM   Modules accepted: Orders

## 2023-07-11 NOTE — Telephone Encounter (Signed)
Pt c/o medication issue:  1. Name of Medication:   apixaban (ELIQUIS) 2.5 MG TABS tablet   2. How are you currently taking this medication (dosage and times per day)?   As prescribed  3. Are you having a reaction (difficulty breathing--STAT)?   4. What is your medication issue?   Son Timothy Lasso) stated they want to get a prescription for 5 mg tablets to go to [facility]) - [place], [place] - [address] [place].  Son stated the wording should read "5 mg, twice a day".

## 2023-07-11 NOTE — Patient Instructions (Signed)
Medication Instructions:   *If you need a refill on your cardiac medications before your next appointment, please call your pharmacy*   Lab Work:  If you have labs (blood work) drawn today and your tests are completely normal, you will receive your results only by: MyChart Message (if you have MyChart) OR A paper copy in the mail If you have any lab test that is abnormal or we need to change your treatment, we will call you to review the results.   Testing/Procedures:    Follow-Up: At [facility], you and your health needs are our priority.  As part of our continuing mission to provide you with exceptional heart care, we have created designated Provider Care Teams.  These Care Teams include your primary Cardiologist (physician) and Advanced Practice Providers (APPs -  Physician Assistants and Nurse Practitioners) who all work together to provide you with the care you need, when you need it.  We recommend signing up for the patient portal called "MyChart".  Sign up information is provided on this After Visit Summary.  MyChart is used to connect with patients for Virtual Visits (Telemedicine).  Patients are able to view lab/test results, encounter notes, upcoming appointments, etc.  Non-urgent messages can be sent to your provider as well.   To learn more about what you can do with MyChart, go to [email or web address].    Your next appointment:   ONE YEAR WITH DR Jacinto Halim

## 2023-07-11 NOTE — Telephone Encounter (Signed)
I spoke with patient's son and explained that the patient should be on Eliquis 2.5 mg bid and cannot continue splitting 5 mg tablets.  He said that he will reach out to Dr Jacinto Halim for clarification.  I ordered Eliquis 2.5 mg bid for refill.

## 2023-07-11 NOTE — Telephone Encounter (Signed)
Spoke with patient's son per DPR and he stated Rx for eliquis was supposed to be for 5 mg tab. [facility] will not send 5 mg tab

## 2023-07-14 NOTE — Telephone Encounter (Signed)
 I spoke to patient's son and informed him that his father should be prescribed Eliquis 2.5 mg bid and we cannot allow splitting 5 mg tablets.  He verbalized understanding

## 2023-08-06 ENCOUNTER — Ambulatory Visit (INDEPENDENT_AMBULATORY_CARE_PROVIDER_SITE_OTHER): Payer: Medicare Other

## 2023-08-06 DIAGNOSIS — I442 Atrioventricular block, complete: Secondary | ICD-10-CM | POA: Diagnosis not present

## 2023-08-07 LAB — CUP PACEART REMOTE DEVICE CHECK
Battery Remaining Longevity: 119 mo
Battery Voltage: 3 V
Brady Statistic RA Percent Paced: 0.04 %
Brady Statistic RV Percent Paced: 97.29 %
Date Time Interrogation Session: 20250319053534
Implantable Lead Connection Status: 753985
Implantable Lead Connection Status: 753985
Implantable Lead Implant Date: 20211021
Implantable Lead Implant Date: 20211021
Implantable Lead Location: 753859
Implantable Lead Location: 753860
Implantable Lead Model: 3830
Implantable Lead Model: 5076
Implantable Pulse Generator Implant Date: 20211021
Lead Channel Impedance Value: 285 Ohm
Lead Channel Impedance Value: 342 Ohm
Lead Channel Impedance Value: 437 Ohm
Lead Channel Impedance Value: 494 Ohm
Lead Channel Pacing Threshold Amplitude: 0.75 V
Lead Channel Pacing Threshold Amplitude: 0.75 V
Lead Channel Pacing Threshold Pulse Width: 0.4 ms
Lead Channel Pacing Threshold Pulse Width: 0.4 ms
Lead Channel Sensing Intrinsic Amplitude: 0.625 mV
Lead Channel Sensing Intrinsic Amplitude: 0.625 mV
Lead Channel Sensing Intrinsic Amplitude: 27.625 mV
Lead Channel Sensing Intrinsic Amplitude: 27.625 mV
Lead Channel Setting Pacing Amplitude: 1.5 V
Lead Channel Setting Pacing Amplitude: 2 V
Lead Channel Setting Pacing Pulse Width: 0.4 ms
Lead Channel Setting Sensing Sensitivity: 1.2 mV
Zone Setting Status: 755011

## 2023-09-19 NOTE — Addendum Note (Signed)
 Addended by: Lott Rouleau A on: 09/19/2023 12:22 PM   Modules accepted: Orders

## 2023-09-19 NOTE — Progress Notes (Signed)
 Remote pacemaker transmission.

## 2023-10-31 ENCOUNTER — Other Ambulatory Visit: Payer: Self-pay | Admitting: Cardiology

## 2023-10-31 DIAGNOSIS — I4821 Permanent atrial fibrillation: Secondary | ICD-10-CM

## 2023-10-31 NOTE — Telephone Encounter (Signed)
 Prescription refill request for Eliquis  received. Indication:afib Last office visit:2/25 Scr:2.24  5/25 Age: 83 Weight:89.9  kg  Prescription refilled

## 2023-11-05 ENCOUNTER — Ambulatory Visit (INDEPENDENT_AMBULATORY_CARE_PROVIDER_SITE_OTHER): Payer: Medicare Other

## 2023-11-05 DIAGNOSIS — I442 Atrioventricular block, complete: Secondary | ICD-10-CM | POA: Diagnosis not present

## 2023-11-06 ENCOUNTER — Ambulatory Visit: Payer: Self-pay | Admitting: Internal Medicine

## 2023-11-06 LAB — CUP PACEART REMOTE DEVICE CHECK
Battery Remaining Longevity: 116 mo
Battery Voltage: 3 V
Brady Statistic RA Percent Paced: 0.07 %
Brady Statistic RV Percent Paced: 94.11 %
Date Time Interrogation Session: 20250617213431
Implantable Lead Connection Status: 753985
Implantable Lead Connection Status: 753985
Implantable Lead Implant Date: 20211021
Implantable Lead Implant Date: 20211021
Implantable Lead Location: 753859
Implantable Lead Location: 753860
Implantable Lead Model: 3830
Implantable Lead Model: 5076
Implantable Pulse Generator Implant Date: 20211021
Lead Channel Impedance Value: 285 Ohm
Lead Channel Impedance Value: 342 Ohm
Lead Channel Impedance Value: 399 Ohm
Lead Channel Impedance Value: 494 Ohm
Lead Channel Pacing Threshold Amplitude: 0.75 V
Lead Channel Pacing Threshold Amplitude: 0.75 V
Lead Channel Pacing Threshold Pulse Width: 0.4 ms
Lead Channel Pacing Threshold Pulse Width: 0.4 ms
Lead Channel Sensing Intrinsic Amplitude: 0.625 mV
Lead Channel Sensing Intrinsic Amplitude: 0.625 mV
Lead Channel Sensing Intrinsic Amplitude: 28 mV
Lead Channel Sensing Intrinsic Amplitude: 28 mV
Lead Channel Setting Pacing Amplitude: 1.5 V
Lead Channel Setting Pacing Amplitude: 2 V
Lead Channel Setting Pacing Pulse Width: 0.4 ms
Lead Channel Setting Sensing Sensitivity: 1.2 mV
Zone Setting Status: 755011

## 2024-01-15 NOTE — Progress Notes (Signed)
 Remote pacemaker transmission.

## 2024-01-15 NOTE — Addendum Note (Signed)
 Addended by: VICCI SELLER A on: 01/15/2024 08:32 AM   Modules accepted: Orders

## 2024-02-04 ENCOUNTER — Ambulatory Visit (INDEPENDENT_AMBULATORY_CARE_PROVIDER_SITE_OTHER): Payer: Medicare Other

## 2024-02-04 DIAGNOSIS — I442 Atrioventricular block, complete: Secondary | ICD-10-CM

## 2024-02-05 LAB — CUP PACEART REMOTE DEVICE CHECK
Battery Remaining Longevity: 114 mo
Battery Voltage: 3 V
Brady Statistic RA Percent Paced: 0.04 %
Brady Statistic RV Percent Paced: 95.39 %
Date Time Interrogation Session: 20250917070407
Implantable Lead Connection Status: 753985
Implantable Lead Connection Status: 753985
Implantable Lead Implant Date: 20211021
Implantable Lead Implant Date: 20211021
Implantable Lead Location: 753859
Implantable Lead Location: 753860
Implantable Lead Model: 3830
Implantable Lead Model: 5076
Implantable Pulse Generator Implant Date: 20211021
Lead Channel Impedance Value: 304 Ohm
Lead Channel Impedance Value: 361 Ohm
Lead Channel Impedance Value: 475 Ohm
Lead Channel Impedance Value: 513 Ohm
Lead Channel Pacing Threshold Amplitude: 0.75 V
Lead Channel Pacing Threshold Amplitude: 0.75 V
Lead Channel Pacing Threshold Pulse Width: 0.4 ms
Lead Channel Pacing Threshold Pulse Width: 0.4 ms
Lead Channel Sensing Intrinsic Amplitude: 1.125 mV
Lead Channel Sensing Intrinsic Amplitude: 1.125 mV
Lead Channel Sensing Intrinsic Amplitude: 31.625 mV
Lead Channel Sensing Intrinsic Amplitude: 31.625 mV
Lead Channel Setting Pacing Amplitude: 1.5 V
Lead Channel Setting Pacing Amplitude: 2 V
Lead Channel Setting Pacing Pulse Width: 0.4 ms
Lead Channel Setting Sensing Sensitivity: 1.2 mV
Zone Setting Status: 755011

## 2024-02-06 ENCOUNTER — Ambulatory Visit: Payer: Self-pay | Admitting: Internal Medicine

## 2024-02-09 NOTE — Progress Notes (Signed)
 Remote PPM Transmission

## 2024-05-05 ENCOUNTER — Ambulatory Visit: Payer: Medicare Other

## 2024-05-05 DIAGNOSIS — I442 Atrioventricular block, complete: Secondary | ICD-10-CM

## 2024-05-06 LAB — CUP PACEART REMOTE DEVICE CHECK
Battery Remaining Longevity: 110 mo
Battery Voltage: 3 V
Brady Statistic RA Percent Paced: 0.02 %
Brady Statistic RV Percent Paced: 94.99 %
Date Time Interrogation Session: 20251217010236
Implantable Lead Connection Status: 753985
Implantable Lead Connection Status: 753985
Implantable Lead Implant Date: 20211021
Implantable Lead Implant Date: 20211021
Implantable Lead Location: 753859
Implantable Lead Location: 753860
Implantable Lead Model: 3830
Implantable Lead Model: 5076
Implantable Pulse Generator Implant Date: 20211021
Lead Channel Impedance Value: 266 Ohm
Lead Channel Impedance Value: 323 Ohm
Lead Channel Impedance Value: 399 Ohm
Lead Channel Impedance Value: 494 Ohm
Lead Channel Pacing Threshold Amplitude: 0.75 V
Lead Channel Pacing Threshold Amplitude: 0.75 V
Lead Channel Pacing Threshold Pulse Width: 0.4 ms
Lead Channel Pacing Threshold Pulse Width: 0.4 ms
Lead Channel Sensing Intrinsic Amplitude: 0.625 mV
Lead Channel Sensing Intrinsic Amplitude: 0.625 mV
Lead Channel Sensing Intrinsic Amplitude: 31.625 mV
Lead Channel Sensing Intrinsic Amplitude: 31.625 mV
Lead Channel Setting Pacing Amplitude: 1.5 V
Lead Channel Setting Pacing Amplitude: 2 V
Lead Channel Setting Pacing Pulse Width: 0.4 ms
Lead Channel Setting Sensing Sensitivity: 1.2 mV
Zone Setting Status: 755011

## 2024-05-06 NOTE — Progress Notes (Signed)
 Remote PPM Transmission

## 2024-05-16 ENCOUNTER — Ambulatory Visit: Payer: Self-pay | Admitting: Internal Medicine
